# Patient Record
Sex: Male | Born: 1985 | Hispanic: Yes | Marital: Single | Smoking: Current every day smoker
Health system: Southern US, Community
[De-identification: ages and names within clinical notes are randomized; demographics above are authoritative.]

## PROBLEM LIST (undated history)

## (undated) DIAGNOSIS — K5909 Other constipation: Secondary | ICD-10-CM

## (undated) DIAGNOSIS — K255 Chronic or unspecified gastric ulcer with perforation: Secondary | ICD-10-CM

## (undated) DIAGNOSIS — K2970 Gastritis, unspecified, without bleeding: Secondary | ICD-10-CM

## (undated) DIAGNOSIS — Z720 Tobacco use: Secondary | ICD-10-CM

## (undated) HISTORY — DX: Chronic or unspecified gastric ulcer with perforation: K25.5

---

## 2014-03-26 ENCOUNTER — Emergency Department (HOSPITAL_COMMUNITY)
Admission: EM | Admit: 2014-03-26 | Discharge: 2014-03-26 | Disposition: A | Discharge status: Home / Self Care | Payer: Self-pay | Source: Home / Self Care

## 2014-03-26 ENCOUNTER — Emergency Department (INDEPENDENT_AMBULATORY_CARE_PROVIDER_SITE_OTHER): Payer: Self-pay

## 2014-03-26 ENCOUNTER — Encounter (HOSPITAL_COMMUNITY): Payer: Self-pay | Admitting: Emergency Medicine

## 2014-03-26 DIAGNOSIS — K2900 Acute gastritis without bleeding: Secondary | ICD-10-CM

## 2014-03-26 MED ORDER — FAMOTIDINE 20 MG PO TABS
ORAL_TABLET | ORAL | Status: AC
  Filled 2014-03-26: qty 1

## 2014-03-26 MED ORDER — GI COCKTAIL ~~LOC~~
30.0000 mL | Freq: Once | ORAL | Status: AC
  Administered 2014-03-26: 30 mL via ORAL

## 2014-03-26 MED ORDER — FAMOTIDINE 20 MG PO TABS
20.0000 mg | ORAL_TABLET | Freq: Once | ORAL | Status: AC
  Administered 2014-03-26: 20 mg via ORAL

## 2014-03-26 MED ORDER — SUCRALFATE 1 GM/10ML PO SUSP: 1.0000 g | Freq: Three times a day (TID) | ORAL | Status: DC

## 2014-03-26 MED ORDER — ONDANSETRON 4 MG PO TBDP
4.0000 mg | ORAL_TABLET | Freq: Once | ORAL | Status: AC
  Administered 2014-03-26: 4 mg via ORAL

## 2014-03-26 MED ORDER — ONDANSETRON 4 MG PO TBDP
ORAL_TABLET | ORAL | Status: AC
  Filled 2014-03-26: qty 1

## 2014-03-26 MED ORDER — ONDANSETRON HCL 4 MG PO TABS: 4.0000 mg | ORAL_TABLET | Freq: Four times a day (QID) | ORAL | Status: DC

## 2014-03-26 MED ORDER — OMEPRAZOLE 20 MG PO CPDR
DELAYED_RELEASE_CAPSULE | ORAL | Status: DC
Start: 2014-03-26 — End: 2020-04-29

## 2014-03-26 MED ORDER — GI COCKTAIL ~~LOC~~
ORAL | Status: AC
  Filled 2014-03-26: qty 30

## 2014-03-26 NOTE — ED Provider Notes (Signed)
Medical screening examination/treatment/procedure(s) were performed by resident physician or non-physician practitioner and as supervising physician I was immediately available for consultation/collaboration.   Arlicia Paquette DOUGLAS MD.   Rexann Lueras D Si Jachim, MD 03/26/14 1316 

## 2014-03-26 NOTE — ED Notes (Signed)
Pt c/o epigastric abd pain onset 3 days Pain is constant and increases w/activity and causes him to vomit Sx also include swelling/bloating??? Denies f/v/n/d, urinary sx Alert w/no signs of acute distress.

## 2014-03-26 NOTE — Discharge Instructions (Signed)
Gastritis - Adultos   (Gastritis, Adult)   La gastrittis es la irritación (inflamación) de la membrana interna del estómago. Puede ser una enfermedad de inicio súbito (aguda) o de largo plazo (crónica). Si la gastritis no se trata, puede causar sangrado y úlceras.  CAUSAS   La gastritis se produce cuando la membrana que tapiza interiormente al estómago se debilita o se daña. Los jugos digestivos del estómago inflaman el revestimiento del estómago debilitado. El revestimiento del estómago puede debilitarse o dañarse por una infección viral o bacteriana. La infección bacteriana más común es la infección por Helicobacter pylori. También puede ser el resultado del consumo excesivo de alcohol, por el uso de ciertos medicamentos o porque hay demasiado ácido en el estómago.   SÍNTOMAS   En algunos casos no hay síntomas. Si se presentan síntomas, éstos pueden ser:   · Dolor o sensación de ardor en la parte superior del abdomen.  · Náuseas.  · Vómitos.  · Sensación molesta de distensión después de comer.  DIAGNÓSTICO   El médico puede diagnosticar gastritis según los síntomas y el examen físico. Para determinar la causa de la gastritis, el médico podrá:   · Pedir análisis de sangre o de materia fecal para diagnosticar la presencia de la bacteria H pylori.  · Gastroscopía. Un tubo delgado y flexible (endoscopio) se pasa por el esófago hasta llegar al estómago. El endoscopio tiene una luz y una cámara en el extremo. El médico utilizará el endoscopio para observar el interior del estómago.  · Tomará una muestra de tejido (biopsia) del estómago para examinarlo en el microscopio.  TRATAMIENTO   Según la causa de la gastritis podrán recetarle: Antibióticos, si la causa es una infección bacteriana, como una infección por H. pylori. Antiácidos o bloqueadores H2, si hay demasiado ácido en el estómago. El médico le aconsejará que deje de tomar aspirina, ibuprofeno u otros antiinflamatorios no esteroides (AINE).   INSTRUCCIONES PARA EL  CUIDADO EN EL HOGAR   · Tome sólo medicamentos de venta libre o recetados, según las indicaciones del médico.  · Si le han recetado antibióticos, tómelos según las indicaciones. Tómelos todos, aunque se sienta mejor.  · Debe ingerir gran cantidad de líquido para mantener la orina de tono claro o color amarillo pálido.  · Evite las comidas y bebidas que empeoran los problemas, como:  · Bebidas con cafeína o alcohólicas.  · Chocolate.  · Sabores a menta.  · Ajo y cebolla.  · Comidas muy condimentadas.  · Cítricos como naranjas, limones o limas.  · Alimentos que contengan tomate, como salsas, chile y pizza.  · Alimentos fritos y grasos.  · Haga comidas pequeñas durante el día en lugar de 3 comidas abundantes.  SOLICITE ATENCIÓN MÉDICA DE INMEDIATO SI:   · La materia fecal es negra o de color rojo oscuro.  · Vomita sangre de color rojo brillante o material similar a granos de café.  · No puede retener los líquidos.  · El dolor abdominal empeora.  · Tiene fiebre.  · No mejora luego de 1 semana.  · Tiene preguntas o preocupaciones.  ASEGÚRESE DE QUE:   · Comprende estas instrucciones.  · Controlará su enfermedad.  · Solicitará ayuda de inmediato si no mejora o si empeora.  Document Released: 07/31/2005 Document Revised: 07/15/2012  [employer]® Patient Information ©2014 [employer].

## 2014-03-26 NOTE — ED Provider Notes (Signed)
CSN: [id number]     Arrival date & time 03/26/14  0944 History   First MD Initiated Contact with Patient 03/26/14 1115     Chief Complaint  Patient presents with  . Abdominal Pain   (Consider location/radiation/quality/duration/timing/severity/associated sxs/prior Treatment) HPI Comments: 28 year old Hispanic male who does not speak [place]. 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  Patient complains of epigastric pain for 3 days. The pain is constant and does not radiate. It is often worse with movement. Nothing else seems to make it worse or better. He denies known trauma. He denies recent alcohol use.  Associated symptoms include nausea and vomiting. He vomited 3 times yesterday but none today. Denies fever, diarrhea or constipation. He has not eaten today. Denies GU symptoms. He does note that he has a history of gastritis but does not know what kind or what caused it.   History reviewed. No pertinent past medical history. History reviewed. No pertinent past surgical history. No family history on file. History  Substance Use Topics  . Smoking status: Current Every Day Smoker    Types: Cigarettes  . Smokeless tobacco: Not on file  . Alcohol Use: Yes    Review of Systems  Constitutional: Positive for activity change and appetite change.  HENT: Negative.   Respiratory: Negative.   Cardiovascular: Negative.   Gastrointestinal: Positive for nausea, vomiting and abdominal pain. Negative for constipation, abdominal distention and anal bleeding.  Genitourinary: Negative.   Skin: Negative.   Neurological: Negative.     Allergies  Review of patient's allergies indicates no known allergies.  Home Medications   Prior to Admission medications   Not on File   BP 125/90  Pulse 68  Temp(Src) 98.5 F (36.9 C) (Oral)  Resp 18  SpO2 100% Physical Exam  Nursing note and vitals reviewed. Constitutional: He is oriented to person, place, and time. He appears well-developed and well-nourished. No  distress.  Neck: Normal range of motion. Neck supple.  Cardiovascular: Normal rate, regular rhythm and normal heart sounds.   Pulmonary/Chest: Effort normal and breath sounds normal. No respiratory distress. He has no wheezes. He has no rales.  Abdominal: Soft. Bowel sounds are normal. He exhibits no distension and no mass. There is tenderness. There is no rebound and no guarding.  Tenderness in the epigastrium only.  Percusses tympanic upper half of abd.  Musculoskeletal: He exhibits no edema.  Neurological: He is alert and oriented to person, place, and time. He exhibits normal muscle tone.  Skin: Skin is warm and dry.    ED Course  Procedures (including critical care time) Labs Review Labs Reviewed - No data to display  Imaging Review Dg Abd 1 View  03/26/2014   CLINICAL DATA:  Vomiting, epigastric pain  EXAM: ABDOMEN - 1 VIEW  COMPARISON:  None.  FINDINGS: Small bowel decompressed. Moderate fecal material near the hepatic flexure. Gaseous distention of a segment of colon in the left mid abdomen. Remainder colon and rectum are decompressed. No abnormal abdominal calcifications. Visualized bones unremarkable.  IMPRESSION: Nonobstructive bowel gas pattern.   Electronically Signed   By: Oley Balmaniel  Hassell M.D.   On: 03/26/2014 11:46     MDM   1. Gastritis, acute    1215: Pt st he feels much better. Pain nearly abated. No N,V.  Follow PCP, see above. Omeprazole 20 mg bid x 15 d carafate as dir zofran 4 mg po q 6h prn n,v.  No acidic or spicy foods. No ETOH. Clear liquids    Hayden Rasmussenavid Genola Yuille,  NP 03/26/14 1225

## 2015-08-05 DIAGNOSIS — K255 Chronic or unspecified gastric ulcer with perforation: Secondary | ICD-10-CM

## 2015-08-05 HISTORY — DX: Chronic or unspecified gastric ulcer with perforation: K25.5

## 2015-08-15 ENCOUNTER — Emergency Department (HOSPITAL_COMMUNITY): Payer: Self-pay

## 2015-08-15 ENCOUNTER — Emergency Department (HOSPITAL_COMMUNITY): Payer: MEDICAID | Admitting: Anesthesiology

## 2015-08-15 ENCOUNTER — Emergency Department (HOSPITAL_COMMUNITY): Payer: Self-pay | Admitting: Anesthesiology

## 2015-08-15 ENCOUNTER — Encounter (HOSPITAL_COMMUNITY): Admission: EM | Disposition: A | Discharge status: Home / Self Care | Payer: Self-pay | Source: Home / Self Care

## 2015-08-15 ENCOUNTER — Inpatient Hospital Stay (HOSPITAL_COMMUNITY)
Admission: EM | Admit: 2015-08-15 | Discharge: 2015-08-19 | DRG: 329 | Disposition: A | Discharge status: Home / Self Care | Payer: Self-pay | Attending: Surgery | Admitting: Surgery

## 2015-08-15 ENCOUNTER — Encounter (HOSPITAL_COMMUNITY): Payer: Self-pay | Admitting: Emergency Medicine

## 2015-08-15 DIAGNOSIS — B9681 Helicobacter pylori [H. pylori] as the cause of diseases classified elsewhere: Secondary | ICD-10-CM | POA: Diagnosis present

## 2015-08-15 DIAGNOSIS — R188 Other ascites: Secondary | ICD-10-CM | POA: Diagnosis present

## 2015-08-15 DIAGNOSIS — K668 Other specified disorders of peritoneum: Secondary | ICD-10-CM

## 2015-08-15 DIAGNOSIS — K659 Peritonitis, unspecified: Secondary | ICD-10-CM | POA: Diagnosis present

## 2015-08-15 DIAGNOSIS — K5900 Constipation, unspecified: Secondary | ICD-10-CM | POA: Diagnosis present

## 2015-08-15 DIAGNOSIS — K5909 Other constipation: Secondary | ICD-10-CM

## 2015-08-15 DIAGNOSIS — IMO0001 Reserved for inherently not codable concepts without codable children: Secondary | ICD-10-CM

## 2015-08-15 DIAGNOSIS — Z720 Tobacco use: Secondary | ICD-10-CM

## 2015-08-15 DIAGNOSIS — F17200 Nicotine dependence, unspecified, uncomplicated: Secondary | ICD-10-CM | POA: Diagnosis present

## 2015-08-15 DIAGNOSIS — K265 Chronic or unspecified duodenal ulcer with perforation: Principal | ICD-10-CM | POA: Diagnosis present

## 2015-08-15 DIAGNOSIS — F17210 Nicotine dependence, cigarettes, uncomplicated: Secondary | ICD-10-CM | POA: Diagnosis present

## 2015-08-15 HISTORY — PX: LAPAROTOMY: SHX154

## 2015-08-15 HISTORY — DX: Tobacco use: Z72.0

## 2015-08-15 HISTORY — DX: Other constipation: K59.09

## 2015-08-15 HISTORY — DX: Gastritis, unspecified, without bleeding: K29.70

## 2015-08-15 LAB — COMPREHENSIVE METABOLIC PANEL
ALK PHOS: 70 U/L (ref 38–126)
ALT: 16 U/L — ABNORMAL LOW (ref 17–63)
ANION GAP: 8 (ref 5–15)
AST: 24 U/L (ref 15–41)
Albumin: 4.1 g/dL (ref 3.5–5.0)
BUN: 9 mg/dL (ref 6–20)
CO2: 28 mmol/L (ref 22–32)
Calcium: 9.3 mg/dL (ref 8.9–10.3)
Chloride: 101 mmol/L (ref 101–111)
Creatinine, Ser: 0.7 mg/dL (ref 0.61–1.24)
GFR calc Af Amer: >60 mL/min (ref >60)
GFR calc non Af Amer: >60 mL/min (ref >60)
Glucose, Bld: 161 mg/dL — ABNORMAL HIGH (ref 65–99)
Potassium: 3.2 mmol/L — ABNORMAL LOW (ref 3.5–5.1)
SODIUM: 137 mmol/L (ref 135–145)
Total Bilirubin: 0.8 mg/dL (ref 0.3–1.2)
Total Protein: 7.3 g/dL (ref 6.5–8.1)

## 2015-08-15 LAB — CBC
HEMATOCRIT: 39.4 % (ref 39.0–52.0)
HEMOGLOBIN: 14.1 g/dL (ref 13.0–17.0)
MCH: 30.9 pg (ref 26.0–34.0)
MCHC: 35.8 g/dL (ref 30.0–36.0)
MCV: 86.4 fL (ref 78.0–100.0)
Platelets: 388 10*3/uL (ref 150–400)
RBC: 4.56 MIL/uL (ref 4.22–5.81)
RDW: 13.1 % (ref 11.5–15.5)
WBC: 10.8 10*3/uL — ABNORMAL HIGH (ref 4.0–10.5)

## 2015-08-15 LAB — URINE MICROSCOPIC-ADD ON

## 2015-08-15 LAB — URINALYSIS, ROUTINE W REFLEX MICROSCOPIC
Bilirubin Urine: NEGATIVE
GLUCOSE, UA: NEGATIVE mg/dL
HGB URINE DIPSTICK: NEGATIVE
Ketones, ur: NEGATIVE mg/dL
Leukocytes, UA: NEGATIVE
Nitrite: NEGATIVE
Protein, ur: NEGATIVE mg/dL
SPECIFIC GRAVITY, URINE: 1.02 (ref 1.005–1.030)
Urobilinogen, UA: 1 mg/dL (ref 0.0–1.0)
pH: 8 (ref 5.0–8.0)

## 2015-08-15 LAB — LIPASE, BLOOD: Lipase: 24 U/L (ref 22–51)

## 2015-08-15 LAB — RAPID URINE DRUG SCREEN, HOSP PERFORMED
AMPHETAMINES: POSITIVE — AB
BARBITURATES: NOT DETECTED
BENZODIAZEPINES: NOT DETECTED
Cocaine: NOT DETECTED
Opiates: NOT DETECTED
Tetrahydrocannabinol: NOT DETECTED

## 2015-08-15 LAB — ETHANOL: Alcohol, Ethyl (B): <5 mg/dL (ref <5)

## 2015-08-15 SURGERY — LAPAROSCOPY, DIAGNOSTIC
Anesthesia: General

## 2015-08-15 SURGERY — LAPAROTOMY, EXPLORATORY
Anesthesia: General

## 2015-08-15 MED ORDER — PHENYLEPHRINE HCL 10 MG/ML IJ SOLN
INTRAMUSCULAR | Status: DC | PRN
  Administered 2015-08-15 (×2): 80 ug via INTRAVENOUS

## 2015-08-15 MED ORDER — LACTATED RINGERS IV SOLN
INTRAVENOUS | Status: DC
  Administered 2015-08-15 – 2015-08-16 (×2): via INTRAVENOUS

## 2015-08-15 MED ORDER — HYDROMORPHONE HCL 1 MG/ML IJ SOLN: 0.2500 mg | INTRAMUSCULAR | Status: DC | PRN

## 2015-08-15 MED ORDER — SUCCINYLCHOLINE CHLORIDE 20 MG/ML IJ SOLN
INTRAMUSCULAR | Status: DC | PRN
  Administered 2015-08-15: 100 mg via INTRAVENOUS

## 2015-08-15 MED ORDER — LIDOCAINE HCL (CARDIAC) 20 MG/ML IV SOLN
INTRAVENOUS | Status: DC | PRN
  Administered 2015-08-15: 100 mg via INTRAVENOUS

## 2015-08-15 MED ORDER — HYDROMORPHONE HCL 1 MG/ML IJ SOLN
0.5000 mg | INTRAMUSCULAR | Status: DC | PRN
  Administered 2015-08-15: 1 mg via INTRAVENOUS
  Filled 2015-08-15 (×2): qty 1

## 2015-08-15 MED ORDER — LACTATED RINGERS IV BOLUS (SEPSIS)
1000.0000 mL | Freq: Three times a day (TID) | INTRAVENOUS | Status: AC | PRN
Start: 2015-08-15 — End: 2015-08-17

## 2015-08-15 MED ORDER — LACTATED RINGERS IV BOLUS (SEPSIS): 1000.0000 mL | Freq: Three times a day (TID) | INTRAVENOUS | Status: AC | PRN

## 2015-08-15 MED ORDER — PIPERACILLIN-TAZOBACTAM 3.375 G IVPB
3.3750 g | Freq: Three times a day (TID) | INTRAVENOUS | Status: DC
  Administered 2015-08-15 – 2015-08-19 (×12): 3.375 g via INTRAVENOUS
  Filled 2015-08-15 (×14): qty 50

## 2015-08-15 MED ORDER — ACETAMINOPHEN 650 MG RE SUPP: 650.0000 mg | Freq: Four times a day (QID) | RECTAL | Status: DC | PRN

## 2015-08-15 MED ORDER — SODIUM CHLORIDE 0.9 % IJ SOLN
INTRAMUSCULAR | Status: AC
  Filled 2015-08-15: qty 10

## 2015-08-15 MED ORDER — PIPERACILLIN-TAZOBACTAM 3.375 G IVPB
INTRAVENOUS | Status: AC
  Filled 2015-08-15: qty 50

## 2015-08-15 MED ORDER — ONDANSETRON HCL 40 MG/20ML IJ SOLN
8.0000 mg | Freq: Four times a day (QID) | INTRAMUSCULAR | Status: DC | PRN
  Filled 2015-08-15: qty 4

## 2015-08-15 MED ORDER — ALUM & MAG HYDROXIDE-SIMETH 200-200-20 MG/5ML PO SUSP: 30.0000 mL | Freq: Four times a day (QID) | ORAL | Status: DC | PRN

## 2015-08-15 MED ORDER — LIP MEDEX EX OINT
1.0000 "application " | TOPICAL_OINTMENT | Freq: Two times a day (BID) | CUTANEOUS | Status: DC
  Administered 2015-08-15 – 2015-08-19 (×6): 1 via TOPICAL
  Filled 2015-08-15 (×2): qty 7

## 2015-08-15 MED ORDER — PIPERACILLIN-TAZOBACTAM 3.375 G IVPB 30 MIN
3.3750 g | INTRAVENOUS | Status: DC
Start: 2015-08-15 — End: 2015-08-15

## 2015-08-15 MED ORDER — PIPERACILLIN-TAZOBACTAM 3.375 G IVPB 30 MIN
3.3750 g | Freq: Once | INTRAVENOUS | Status: AC
  Administered 2015-08-15: 3.375 g via INTRAVENOUS
  Filled 2015-08-15: qty 50

## 2015-08-15 MED ORDER — MENTHOL 3 MG MT LOZG
1.0000 | LOZENGE | OROMUCOSAL | Status: DC | PRN
Start: 2015-08-15 — End: 2015-08-19
  Filled 2015-08-15: qty 9

## 2015-08-15 MED ORDER — ONDANSETRON HCL 4 MG/2ML IJ SOLN: 4.0000 mg | Freq: Four times a day (QID) | INTRAMUSCULAR | Status: DC | PRN

## 2015-08-15 MED ORDER — PROPOFOL 10 MG/ML IV BOLUS
INTRAVENOUS | Status: AC
  Filled 2015-08-15: qty 20

## 2015-08-15 MED ORDER — PHENOL 1.4 % MT LIQD
2.0000 | OROMUCOSAL | Status: DC | PRN
  Filled 2015-08-15: qty 177

## 2015-08-15 MED ORDER — LORAZEPAM 2 MG/ML IJ SOLN: 0.5000 mg | Freq: Three times a day (TID) | INTRAMUSCULAR | Status: DC | PRN

## 2015-08-15 MED ORDER — GI COCKTAIL ~~LOC~~: 30.0000 mL | Freq: Once | ORAL | Status: DC

## 2015-08-15 MED ORDER — SODIUM CHLORIDE 0.9 % IV SOLN
INTRAVENOUS | Status: DC
  Filled 2015-08-15: qty 6

## 2015-08-15 MED ORDER — LACTATED RINGERS IV BOLUS (SEPSIS)
1000.0000 mL | Freq: Once | INTRAVENOUS | Status: AC
  Administered 2015-08-15: 1000 mL via INTRAVENOUS

## 2015-08-15 MED ORDER — SODIUM CHLORIDE 0.9 % IV SOLN: INTRAVENOUS | Status: DC

## 2015-08-15 MED ORDER — OXYCODONE HCL 5 MG PO TABS: 5.0000 mg | ORAL_TABLET | Freq: Once | ORAL | Status: DC | PRN

## 2015-08-15 MED ORDER — PROMETHAZINE HCL 25 MG/ML IJ SOLN
6.2500 mg | INTRAMUSCULAR | Status: DC | PRN
  Filled 2015-08-15: qty 1

## 2015-08-15 MED ORDER — FENTANYL CITRATE (PF) 100 MCG/2ML IJ SOLN
INTRAMUSCULAR | Status: DC | PRN
  Administered 2015-08-15 (×2): 50 ug via INTRAVENOUS
  Administered 2015-08-15: 100 ug via INTRAVENOUS
  Administered 2015-08-15 (×3): 50 ug via INTRAVENOUS

## 2015-08-15 MED ORDER — SODIUM CHLORIDE 0.9 % IV BOLUS (SEPSIS)
2000.0000 mL | Freq: Once | INTRAVENOUS | Status: AC
  Administered 2015-08-15: 2000 mL via INTRAVENOUS

## 2015-08-15 MED ORDER — FENTANYL CITRATE (PF) 250 MCG/5ML IJ SOLN
INTRAMUSCULAR | Status: AC
  Filled 2015-08-15: qty 25

## 2015-08-15 MED ORDER — SODIUM CHLORIDE 0.9 % IV BOLUS (SEPSIS)
1000.0000 mL | Freq: Once | INTRAVENOUS | Status: AC
  Administered 2015-08-15: 1000 mL via INTRAVENOUS

## 2015-08-15 MED ORDER — CETYLPYRIDINIUM CHLORIDE 0.05 % MT LIQD
7.0000 mL | Freq: Two times a day (BID) | OROMUCOSAL | Status: DC
  Administered 2015-08-15 – 2015-08-18 (×6): 7 mL via OROMUCOSAL

## 2015-08-15 MED ORDER — METOPROLOL TARTRATE 1 MG/ML IV SOLN
5.0000 mg | Freq: Four times a day (QID) | INTRAVENOUS | Status: DC | PRN
  Filled 2015-08-15: qty 5

## 2015-08-15 MED ORDER — HYDROMORPHONE HCL 1 MG/ML IJ SOLN
0.5000 mg | INTRAMUSCULAR | Status: DC | PRN
  Administered 2015-08-16: 1 mg via INTRAVENOUS

## 2015-08-15 MED ORDER — ENOXAPARIN SODIUM 40 MG/0.4ML ~~LOC~~ SOLN
40.0000 mg | SUBCUTANEOUS | Status: DC
  Administered 2015-08-16 – 2015-08-19 (×4): 40 mg via SUBCUTANEOUS
  Filled 2015-08-15 (×5): qty 0.4

## 2015-08-15 MED ORDER — MIDAZOLAM HCL 2 MG/2ML IJ SOLN
INTRAMUSCULAR | Status: AC
  Filled 2015-08-15: qty 4

## 2015-08-15 MED ORDER — LACTATED RINGERS IV SOLN
INTRAVENOUS | Status: DC | PRN
  Administered 2015-08-15: 07:00:00 via INTRAVENOUS

## 2015-08-15 MED ORDER — IOHEXOL 300 MG/ML  SOLN
100.0000 mL | Freq: Once | INTRAMUSCULAR | Status: AC | PRN
  Administered 2015-08-15: 100 mL via INTRAVENOUS

## 2015-08-15 MED ORDER — ONDANSETRON HCL 4 MG/2ML IJ SOLN
INTRAMUSCULAR | Status: DC | PRN
  Administered 2015-08-15: 4 mg via INTRAVENOUS

## 2015-08-15 MED ORDER — SODIUM CHLORIDE 0.9 % IR SOLN
Status: DC | PRN
  Administered 2015-08-15: 7000 mL

## 2015-08-15 MED ORDER — VANCOMYCIN HCL IN DEXTROSE 1-5 GM/200ML-% IV SOLN
1000.0000 mg | Freq: Once | INTRAVENOUS | Status: AC
  Administered 2015-08-15: 1000 mg via INTRAVENOUS
  Filled 2015-08-15: qty 200

## 2015-08-15 MED ORDER — PROPOFOL 10 MG/ML IV BOLUS
INTRAVENOUS | Status: DC | PRN
  Administered 2015-08-15: 200 mg via INTRAVENOUS

## 2015-08-15 MED ORDER — SODIUM CHLORIDE 0.9 % IV SOLN
80.0000 mg | Freq: Two times a day (BID) | INTRAVENOUS | Status: DC
  Administered 2015-08-15 – 2015-08-18 (×7): 80 mg via INTRAVENOUS
  Filled 2015-08-15 (×9): qty 80

## 2015-08-15 MED ORDER — FENTANYL CITRATE (PF) 100 MCG/2ML IJ SOLN
INTRAMUSCULAR | Status: AC
  Filled 2015-08-15: qty 4

## 2015-08-15 MED ORDER — SUGAMMADEX SODIUM 200 MG/2ML IV SOLN
INTRAVENOUS | Status: AC
  Filled 2015-08-15: qty 2

## 2015-08-15 MED ORDER — MAGIC MOUTHWASH
15.0000 mL | Freq: Four times a day (QID) | ORAL | Status: DC | PRN
  Filled 2015-08-15: qty 15

## 2015-08-15 MED ORDER — ONDANSETRON HCL 4 MG/2ML IJ SOLN
INTRAMUSCULAR | Status: AC
  Filled 2015-08-15: qty 2

## 2015-08-15 MED ORDER — ONDANSETRON HCL 4 MG/2ML IJ SOLN
4.0000 mg | Freq: Once | INTRAMUSCULAR | Status: AC | PRN
  Administered 2015-08-15: 4 mg via INTRAVENOUS
  Filled 2015-08-15: qty 2

## 2015-08-15 MED ORDER — METHOCARBAMOL 1000 MG/10ML IJ SOLN
1000.0000 mg | Freq: Four times a day (QID) | INTRAVENOUS | Status: DC | PRN
  Filled 2015-08-15: qty 10

## 2015-08-15 MED ORDER — MIDAZOLAM HCL 5 MG/5ML IJ SOLN
INTRAMUSCULAR | Status: DC | PRN
  Administered 2015-08-15: 1 mg via INTRAVENOUS

## 2015-08-15 MED ORDER — BUPIVACAINE-EPINEPHRINE (PF) 0.25% -1:200000 IJ SOLN
INTRAMUSCULAR | Status: AC
  Filled 2015-08-15: qty 60

## 2015-08-15 MED ORDER — LACTATED RINGERS IV SOLN
INTRAVENOUS | Status: DC | PRN
  Administered 2015-08-15 (×2): via INTRAVENOUS

## 2015-08-15 MED ORDER — DEXAMETHASONE SODIUM PHOSPHATE 10 MG/ML IJ SOLN
INTRAMUSCULAR | Status: DC | PRN
  Administered 2015-08-15: 10 mg via INTRAVENOUS

## 2015-08-15 MED ORDER — SUGAMMADEX SODIUM 200 MG/2ML IV SOLN
INTRAVENOUS | Status: DC | PRN
  Administered 2015-08-15: 200 mg via INTRAVENOUS

## 2015-08-15 MED ORDER — OXYCODONE HCL 5 MG/5ML PO SOLN
5.0000 mg | Freq: Once | ORAL | Status: DC | PRN
  Filled 2015-08-15: qty 5

## 2015-08-15 MED ORDER — BUPIVACAINE HCL (PF) 0.25 % IJ SOLN
INTRAMUSCULAR | Status: DC | PRN
  Administered 2015-08-15: 30 mL

## 2015-08-15 MED ORDER — ROCURONIUM BROMIDE 100 MG/10ML IV SOLN
INTRAVENOUS | Status: DC | PRN
  Administered 2015-08-15: 50 mg via INTRAVENOUS

## 2015-08-15 MED ORDER — PANTOPRAZOLE SODIUM 40 MG IV SOLR
40.0000 mg | Freq: Once | INTRAVENOUS | Status: AC
  Administered 2015-08-15: 40 mg via INTRAVENOUS
  Filled 2015-08-15: qty 40

## 2015-08-15 MED ORDER — DIPHENHYDRAMINE HCL 50 MG/ML IJ SOLN: 12.5000 mg | Freq: Four times a day (QID) | INTRAMUSCULAR | Status: DC | PRN

## 2015-08-15 SURGICAL SUPPLY — 51 items
BLADE EXTENDED COATED 6.5IN (ELECTRODE) IMPLANT
BLADE HEX COATED 2.75 (ELECTRODE) ×6 IMPLANT
CABLE HIGH FREQUENCY MONO STRZ (ELECTRODE) ×3 IMPLANT
CATH KIT ON-Q SILVERSOAK 7.5IN (CATHETERS) IMPLANT
COUNTER NEEDLE 20 DBL MAG RED (NEEDLE) ×3 IMPLANT
COVER MAYO STAND STRL (DRAPES) ×6 IMPLANT
COVER SURGICAL LIGHT HANDLE (MISCELLANEOUS) ×3 IMPLANT
DRAIN CHANNEL 19F RND (DRAIN) IMPLANT
DRAPE LAPAROSCOPIC ABDOMINAL (DRAPES) ×3 IMPLANT
DRAPE SHEET LG 3/4 BI-LAMINATE (DRAPES) ×3 IMPLANT
DRAPE UTILITY XL STRL (DRAPES) ×6 IMPLANT
DRAPE WARM FLUID 44X44 (DRAPE) ×3 IMPLANT
DRSG OPSITE POSTOP 4X10 (GAUZE/BANDAGES/DRESSINGS) IMPLANT
DRSG OPSITE POSTOP 4X6 (GAUZE/BANDAGES/DRESSINGS) IMPLANT
DRSG OPSITE POSTOP 4X8 (GAUZE/BANDAGES/DRESSINGS) IMPLANT
ELECT PENCIL ROCKER SW 15FT (MISCELLANEOUS) ×3 IMPLANT
ELECT REM PT RETURN 9FT ADLT (ELECTROSURGICAL) ×3
ELECTRODE REM PT RTRN 9FT ADLT (ELECTROSURGICAL) ×1 IMPLANT
GAUZE SPONGE 4X4 12PLY STRL (GAUZE/BANDAGES/DRESSINGS) ×3 IMPLANT
GLOVE ECLIPSE 8.0 STRL XLNG CF (GLOVE) ×6 IMPLANT
GLOVE INDICATOR 8.0 STRL GRN (GLOVE) ×6 IMPLANT
GOWN STRL REUS W/TWL XL LVL3 (GOWN DISPOSABLE) ×12 IMPLANT
KIT BASIN OR (CUSTOM PROCEDURE TRAY) ×3 IMPLANT
LEGGING LITHOTOMY PAIR STRL (DRAPES) ×3 IMPLANT
LIGASURE IMPACT 36 18CM CVD LR (INSTRUMENTS) IMPLANT
PACK GENERAL/GYN (CUSTOM PROCEDURE TRAY) ×3 IMPLANT
STAPLER VISISTAT 35W (STAPLE) ×3 IMPLANT
SUCTION POOLE TIP (SUCTIONS) ×3 IMPLANT
SUT MNCRL AB 4-0 PS2 18 (SUTURE) IMPLANT
SUT NOV 1 T60/GS (SUTURE) IMPLANT
SUT NOVA NAB DX-16 0-1 5-0 T12 (SUTURE) IMPLANT
SUT NOVA T20/GS 25 (SUTURE) IMPLANT
SUT PDS AB 1 CTX 36 (SUTURE) IMPLANT
SUT PDS AB 2-0 CT2 27 (SUTURE) ×6 IMPLANT
SUT SILK 2 0 (SUTURE) ×2
SUT SILK 2 0 SH CR/8 (SUTURE) ×3 IMPLANT
SUT SILK 2-0 18XBRD TIE 12 (SUTURE) ×1 IMPLANT
SUT SILK 3 0 (SUTURE) ×2
SUT SILK 3 0 SH CR/8 (SUTURE) ×3 IMPLANT
SUT SILK 3-0 18XBRD TIE 12 (SUTURE) ×1 IMPLANT
SUT VIC AB 2-0 SH 18 (SUTURE) ×3 IMPLANT
SUT VIC AB 3-0 SH 18 (SUTURE) ×3 IMPLANT
SUT VICRYL 2 0 18  UND BR (SUTURE) ×2
SUT VICRYL 2 0 18 UND BR (SUTURE) ×1 IMPLANT
TOWEL OR 17X26 10 PK STRL BLUE (TOWEL DISPOSABLE) ×6 IMPLANT
TOWEL OR NON WOVEN STRL DISP B (DISPOSABLE) ×6 IMPLANT
TRAY FOLEY W/METER SILVER 14FR (SET/KITS/TRAYS/PACK) ×3 IMPLANT
TRAY FOLEY W/METER SILVER 16FR (SET/KITS/TRAYS/PACK) ×3 IMPLANT
TROCAR BLADELESS OPT 5 100 (ENDOMECHANICALS) ×3 IMPLANT
TUNNELER SHEATH ON-Q 16GX12 DP (PAIN MANAGEMENT) IMPLANT
YANKAUER SUCT BULB TIP 10FT TU (MISCELLANEOUS) ×3 IMPLANT

## 2015-08-15 NOTE — Care Management Note (Signed)
Case Management Note  Patient Details  Name: Ricardo Mckinney MRN: [id number] Date of Birth: 1986/03/30  Subjective/Objective:                   ruptured ulcer Action/Plan:  Discharge planning Expected Discharge Date:                  Expected Discharge Plan:  Home/Self Care  In-House Referral:     Discharge planning Services  CM Consult, MATCH Program, Indigent Health Clinic  Post Acute Care Choice:    Choice offered to:     DME Arranged:    DME Agency:     HH Arranged:    HH Agency:     Status of Service:  In process, will continue to follow  Medicare Important Message Given:    Date Medicare IM Given:    Medicare IM give by:    Date Additional Medicare IM Given:    Additional Medicare Important Message give by:     If discussed at Long Length of Stay Meetings, dates discussed:    Additional Comments: Utilization Review complete.  Pt post op (less than 4 hours) and from CM record review will have MEDICATION, PCP, INSURANCE, and possible MATCH at discharge.  CM will meet with pt to discuss needs 10/12 when pt is better able to communicate needs. Yves Dill, RN 08/15/2015, 12:30 PM

## 2015-08-15 NOTE — Addendum Note (Signed)
Addendum  created 08/15/15 1028 by Illene Silver, CRNA   Modules edited: Charges VN

## 2015-08-15 NOTE — H&P (Signed)
[facility]  [address] [place], [place] [address] Phone: [phone] FAX: [place]  1985/11/27 [id number]  CARE TEAM:  PCP: No primary care provider on file.  Outpatient Care Team: No care team member to display  Inpatient Treatment Team: Treatment Team: Attending Provider: April Palumbo, MD; Technician: Elwanda Brooklyn, NT; Registered Nurse: Murvin Donning Ward, RN; Registered Nurse: Markus Daft, RN; Consulting Physician: Nolon Nations, MD  This patient is a 29 y.o.male who presents today for surgical evaluation at the request of Dr Randal Buba.   Reason for evaluation: Abdominal pain and free air.  Probable perforated ulcer.  Young Hispanic male originally from [place].  Recalls being told he had gastritis.  Has not drunk alcohol in over a year.  His not on any medications.  Has had intermittent pains for the past few months.  Mild.  However his pain became much more intense over the past few days.  He had worsening nausea and vomiting.  Apparently family called an ambulance and he was brought in somewhat sleepy and confused.  Receive IV fluids and medications.  A little more alert.  Complained of epigastric upper abdominal pain.  Very intense.  X-rays concerning for free air.  CT scan concerning for free air and upper abdomen and fluid and inflammation in the upper digestive tract.  Most suspicious for perforated ulcer.  Patient does have constipation with a bowel movement once or twice a week.  Does not take any nonsteroidals.  Again has been abstinent from alcohol for over a year.  Smokes about a pack and a half of cigarettes a day.  He is here today with his brother-in-law.  While his brother-in-law is relatively bilingual, he speaks very little [place].  We used a phone interpreter.  Emergency department nurse in the room at all times as well.  Patient's father had died and had history of ulcers as well he thinks.  Not note any  family history of other health problems.  No history of diverticulosis or diverticulitis.  No history of heart attack or strokes.  Is otherwise moderately physically active.  History reviewed. No pertinent past medical history.  History reviewed. No pertinent past surgical history.  Social History   Social History  . Marital Status: Married    Spouse Name: N/A  . Number of Children: N/A  . Years of Education: N/A   Occupational History  . Not on file.   Social History Main Topics  . Smoking status: Current Every Day Smoker    Types: Cigarettes  . Smokeless tobacco: Not on file  . Alcohol Use: Yes  . Drug Use: No  . Sexual Activity: Not on file   Other Topics Concern  . Not on file   Social History Narrative    History reviewed. No pertinent family history.  Current Facility-Administered Medications  Medication Dose Route Frequency Provider Last Rate Last Dose  . piperacillin-tazobactam (ZOSYN) IVPB 3.375 g  3.375 g Intravenous Once April Palumbo, MD      . sodium chloride 0.9 % bolus 2,000 mL  2,000 mL Intravenous Once April Palumbo, MD      . vancomycin (VANCOCIN) IVPB 1000 mg/200 mL premix  1,000 mg Intravenous Once April Palumbo, MD       Current Outpatient Prescriptions  Medication Sig Dispense Refill  . omeprazole (PRILOSEC) 20 MG capsule 1 bid x 15 d (Patient not taking: Reported on 08/15/2015) 30 capsule 0  .  ondansetron (ZOFRAN) 4 MG tablet Take 1 tablet (4 mg total) by mouth every 6 (six) hours. As needed for nausea and vomiting (Patient not taking: Reported on 08/15/2015) 12 tablet 0  . sucralfate (CARAFATE) 1 GM/10ML suspension Take 10 mLs (1 g total) by mouth 4 (four) times daily -  with meals and at bedtime. (Patient not taking: Reported on 08/15/2015) 420 mL 0     No Known Allergies  ROS: Constitutional:  No fevers, chills, sweats.  Weight stable Eyes:  No vision changes, No discharge HENT:  No sore throats, nasal drainage Lymph: No neck swelling, No  bruising easily Pulmonary:  No cough, productive sputum CV: No orthopnea, PND  Patient walks without difficulty.  No exertional chest/neck/shoulder/arm pain. GI: No personal nor family history of GI/colon cancer, inflammatory bowel disease, irritable bowel syndrome, allergy such as Celiac Sprue, dietary/dairy problems, colitis.  No recent sick contacts/gastroenteritis.  No recent travel outside the country.  No changes in diet. Renal: No UTIs, No hematuria Genital:  No drainage, bleeding, masses Musculoskeletal: No severe joint pain.  Good ROM major joints Skin:  No sores or lesions.  No rashes Heme/Lymph:  No easy bleeding.  No swollen lymph nodes Neuro: No focal weakness/numbness.  No seizures Psych: No suicidal ideation.  No hallucinations  BP 109/64 mmHg  Pulse 80  Temp(Src) 98.1 F (36.7 C) (Oral)  Resp 20  SpO2 99%  Physical Exam: General: Pt awake/alert/oriented x4 in mild acute distress Eyes: PERRL, normal EOM. Sclera nonicteric Neuro: CN II-XII intact w/o focal sensory/motor deficits. Lymph: No head/neck/groin lymphadenopathy Psych:  No delerium/psychosis/paranoia.  Initially calm.  Then anxious.  Consolable. HENT: Normocephalic, Mucus membranes moist.  No thrush Neck: Supple, No tracheal deviation Chest: No pain.  Good respiratory excursion. CV:  Pulses intact.  Regular rhythm Abdomen: Thin and muscular.  Lower abdomen soft but upper abdomen more firm.  No incisions.  Nondistended.  Very tender in epigastric upper abdomen with some mild guarding.  Reproduction with cough.  Concerning for early peritonitis.  Nontender.  No  hernias. Genital: Normal external male genitalia.  No inguinal hernias. Ext:  SCDs BLE.  No significant edema.  No cyanosis Skin: No petechiae / purpurea.  No major sores Musculoskeletal: No severe joint pain.  Good ROM major joints   Results:   Labs: Results for orders placed or performed during the hospital encounter of 08/15/15 (from the past 48  hour(s))  Lipase, blood     Status: None   Collection Time: 08/15/15  1:34 AM  Result Value Ref Range   Lipase 24 22 - 51 U/L  Comprehensive metabolic panel     Status: Abnormal   Collection Time: 08/15/15  1:34 AM  Result Value Ref Range   Sodium 137 135 - 145 mmol/L   Potassium 3.2 (L) 3.5 - 5.1 mmol/L   Chloride 101 101 - 111 mmol/L   CO2 28 22 - 32 mmol/L   Glucose, Bld 161 (H) 65 - 99 mg/dL   BUN 9 6 - 20 mg/dL   Creatinine, Ser 0.70 0.61 - 1.24 mg/dL   Calcium 9.3 8.9 - 10.3 mg/dL   Total Protein 7.3 6.5 - 8.1 g/dL   Albumin 4.1 3.5 - 5.0 g/dL   AST 24 15 - 41 U/L   ALT 16 (L) 17 - 63 U/L   Alkaline Phosphatase 70 38 - 126 U/L   Total Bilirubin 0.8 0.3 - 1.2 mg/dL   GFR calc non Af Amer >60 >60 mL/min  GFR calc Af Amer >60 >60 mL/min    Comment: (NOTE) The eGFR has been calculated using the CKD EPI equation. This calculation has not been validated in all clinical situations. eGFR's persistently <60 mL/min signify possible Chronic Kidney Disease.    Anion gap 8 5 - 15  CBC     Status: Abnormal   Collection Time: 08/15/15  1:34 AM  Result Value Ref Range   WBC 10.8 (H) 4.0 - 10.5 K/uL   RBC 4.56 4.22 - 5.81 MIL/uL   Hemoglobin 14.1 13.0 - 17.0 g/dL   HCT 39.4 39.0 - 52.0 %   MCV 86.4 78.0 - 100.0 fL   MCH 30.9 26.0 - 34.0 pg   MCHC 35.8 30.0 - 36.0 g/dL   RDW 13.1 11.5 - 15.5 %   Platelets 388 150 - 400 K/uL  Ethanol     Status: None   Collection Time: 08/15/15  1:40 AM  Result Value Ref Range   Alcohol, Ethyl (B) <5 <5 mg/dL    Comment:        LOWEST DETECTABLE LIMIT FOR SERUM ALCOHOL IS 5 mg/dL FOR MEDICAL PURPOSES ONLY   Urinalysis, Routine w reflex microscopic (not at [facility])     Status: Abnormal   Collection Time: 08/15/15  1:49 AM  Result Value Ref Range   Color, Urine AMBER (A) YELLOW    Comment: BIOCHEMICALS MAY BE AFFECTED BY COLOR   APPearance TURBID (A) CLEAR   Specific Gravity, Urine 1.020 1.005 - 1.030   pH 8.0 5.0 - 8.0   Glucose, UA  NEGATIVE NEGATIVE mg/dL   Hgb urine dipstick NEGATIVE NEGATIVE   Bilirubin Urine NEGATIVE NEGATIVE   Ketones, ur NEGATIVE NEGATIVE mg/dL   Protein, ur NEGATIVE NEGATIVE mg/dL   Urobilinogen, UA 1.0 0.0 - 1.0 mg/dL   Nitrite NEGATIVE NEGATIVE   Leukocytes, UA NEGATIVE NEGATIVE  Urine rapid drug screen (hosp performed)     Status: Abnormal   Collection Time: 08/15/15  1:49 AM  Result Value Ref Range   Opiates NONE DETECTED NONE DETECTED   Cocaine NONE DETECTED NONE DETECTED   Benzodiazepines NONE DETECTED NONE DETECTED   Amphetamines POSITIVE (A) NONE DETECTED   Tetrahydrocannabinol NONE DETECTED NONE DETECTED   Barbiturates NONE DETECTED NONE DETECTED    Comment:        DRUG SCREEN FOR MEDICAL PURPOSES ONLY.  IF CONFIRMATION IS NEEDED FOR ANY PURPOSE, NOTIFY LAB WITHIN 5 DAYS.        LOWEST DETECTABLE LIMITS FOR URINE DRUG SCREEN Drug Class       Cutoff (ng/mL) Amphetamine      1000 Barbiturate      200 Benzodiazepine   867 Tricyclics       619 Opiates          300 Cocaine          300 THC              50   Urine microscopic-add on     Status: Abnormal   Collection Time: 08/15/15  1:49 AM  Result Value Ref Range   WBC, UA 0-2 <3 WBC/hpf   Bacteria, UA FEW (A) RARE   Urine-Other MUCOUS PRESENT     Comment: AMORPHOUS URATES/PHOSPHATES    Imaging / Studies: Ct Abdomen Pelvis W Contrast  08/15/2015   CLINICAL DATA:  Right upper quadrant pain for 5 months, worse tonight. Nausea. No known injury. Elevated white cell count. Free air demonstrated on abdominal radiographs.  EXAM: CT ABDOMEN  AND PELVIS WITH CONTRAST  TECHNIQUE: Multidetector CT imaging of the abdomen and pelvis was performed using the standard protocol following bolus administration of intravenous contrast.  CONTRAST:  153mL OMNIPAQUE IOHEXOL 300 MG/ML  SOLN  COMPARISON:  Abdomen 08/15/2015  FINDINGS: Lung bases are clear.  There is free intra-abdominal air demonstrated mostly in the upper abdomen and beneath the  anterior hemidiaphragms. Small gas bubbles are present in the porta hepatis. There is a small amount of free fluid demonstrated around the gastric pylorus and duodenal bulb region. Bowel wall is mildly thickened in this area. Free fluid also tracks along the pericolic gutters into the pelvis. Fluid attenuation is mildly increased. Appearance is likely due to peptic ulcer disease involving the pylorus and/or duodenal bulb with associated perforation. No pneumatosis or portal venous gas is identified.  Cholelithiasis with contracted gallbladder. No gallbladder wall thickening. No bile duct dilatation. Liver, spleen, pancreas, adrenal glands, kidneys, abdominal aorta, inferior vena cava, and retroperitoneal lymph nodes are unremarkable. Small bowel and colon are not abnormally distended. Stool in the colon.  Pelvis: Appendix is not identified. Bladder is decompressed. Prostate gland is not enlarged. No pelvic mass or lymphadenopathy. No destructive bone lesions.  IMPRESSION: Free intra-abdominal air and free fluid in the abdomen and pelvis. Fluid is present around the pyloric region of the stomach and the duodenal bulb. This suggests probable perforating ulcer as cause of free air and free fluid. Cholelithiasis.  These results were called by telephone at the time of interpretation on 08/15/2015 at 4:33 am to Dr. Veatrice Kells , who verbally acknowledged these results.   Electronically Signed   By: Lucienne Capers M.D.   On: 08/15/2015 04:36   Dg Abd Acute W/chest  08/15/2015   CLINICAL DATA:  Initial evaluation for 1 year history of right upper quadrant pain, worse this morning.  EXAM: DG ABDOMEN ACUTE W/ 1V CHEST  COMPARISON:  Prior radiograph from 03/26/2014.  FINDINGS: Cardiac and mediastinal silhouettes are within normal limits.  Lungs are normally inflated. No focal infiltrate, pulmonary edema, or pleural effusion. No pneumothorax.  Bowel gas pattern within normal limits without evidence of obstruction or  ileus. Moderate amount retained stool within the colon. On upright projection, there is gas lucency subjacent to the right hemidiaphragm, suspicious for small amount of free air. This is seen on both upright chest and upright abdominal films.  No soft tissue mass or abnormal calcification.  No acute osseus abnormality.  IMPRESSION: 1. Small amount of gas lucency subjacent to the right hemidiaphragm, concerning for free intraperitoneal air. Further evaluation with dedicated cross-sectional imaging of the abdomen and pelvis recommended. 2. Nonobstructive bowel gas pattern. 3. No active cardiopulmonary disease. Critical Value/emergent results were called by telephone at the time of interpretation on 08/15/2015 at 3:43 am to Dr. Veatrice Kells , who verbally acknowledged these results.   Electronically Signed   By: Jeannine Boga M.D.   On: 08/15/2015 03:45    Medications / Allergies: per chart  Antibiotics: Anti-infectives    Start     Dose/Rate Route Frequency Ordered Stop   08/15/15 0445  vancomycin (VANCOCIN) IVPB 1000 mg/200 mL premix     1,000 mg 200 mL/hr over 60 Minutes Intravenous  Once 08/15/15 0431     08/15/15 0445  piperacillin-tazobactam (ZOSYN) IVPB 3.375 g     3.375 g 100 mL/hr over 30 Minutes Intravenous  Once 08/15/15 [place]  29 y.o. male  Problem List:  Principal Problem:   Perforated duodenal ulcer  Active Problems:   Spanish speaking patient   Free air and upper abdominal pain with distant history of gastritis.  Strongly suspicious for perforated duodenal ulcer. Plan:   Admit.  IV fluids.  IV antibiotic.  IV proton pump inhibitor.  Emergent abdominal exploration.  Given the fact he is not in shock nor massively distended, we will start with laparoscopic exploration.  Possible conversion to open.  I suspect is a perforated ulcer which will benefit from omental patching.  Another possibility is perforated diverticulitis  which may be possible given his history of constipation as well although seems less likely in a 29 year old.  Discussed with the patient at length with the brother-in-law and emergency room department nurse present.  Use of a phone interpreter.  Patient was tearful and emotional when I pointed out the seriousness of the situation but agrees with surgery to try and help save his life:  The anatomy & physiology of the digestive tract was discussed.  The pathophysiology of perforation was discussed.  Differential diagnosis such as perforated ulcer or colon, etc was discussed.   Natural history risks without surgery such as death was discussed.  I recommended abdominal exploration to diagnose & treat the source of the problem.  Laparoscopic & open techniques were discussed.   Risks such as bleeding, infection, abscess, leak, reoperation, bowel resection, possible ostomy, injury to other organs, need for repair of tissues / organs, hernia, heart attack, death, and other risks were discussed.   The risks of no intervention will lead to serious problems including death.   I expressed a good likelihood that surgery will address the problem.    Goals of post-operative recovery were discussed as well.  We will work to minimize complications although risks in an emergent setting are high.   Questions were answered.  The patient & brother in law expressed understanding & wishes to proceed with surgery.  The brother-in-law's discussed with the patient's sister as well whom agrees.       -VTE prophylaxis- SCDs, etc -mobilize as tolerated to help recovery    Adin Hector, M.D., F.A.C.S. Gastrointestinal and Minimally Invasive Surgery [facility] [address], [place], [place] [address] [phone] Main / Paging   08/15/2015  Note: Portions of this report may have been transcribed using voice recognition software. Every effort was made to ensure accuracy; however,  inadvertent computerized transcription errors may be present.   Any transcriptional errors that result from this process are unintentional.

## 2015-08-15 NOTE — Anesthesia Procedure Notes (Signed)
Procedure Name: Intubation Date/Time: 08/15/2015 7:25 AM Performed by: Anastasio Champion E Pre-anesthesia Checklist: Patient identified, Timeout performed, Emergency Drugs available, Suction available and Patient being monitored Patient Re-evaluated:Patient Re-evaluated prior to inductionOxygen Delivery Method: Circle system utilized Preoxygenation: Pre-oxygenation with 100% oxygen Intubation Type: IV induction Laryngoscope Size: Glidescope and 3 Grade View: Grade I Tube type: Oral Airway Equipment and Method: Stylet Placement Confirmation: ETT inserted through vocal cords under direct vision,  positive ETCO2 and CO2 detector Secured at: 22 cm Tube secured with: Tape (22) Dental Injury: Teeth and Oropharynx as per pre-operative assessment

## 2015-08-15 NOTE — Op Note (Signed)
08/15/2015  8:42 AM  PATIENT:  Ricardo Mckinney  29 y.o. male  No care team member to display  PRE-OPERATIVE DIAGNOSIS:  Free air  POST-OPERATIVE DIAGNOSIS:  ruptured ulcer  PROCEDURE:  Procedure(s): EXPLORATORY LAPAROTOMY with possible duodenal repair  SURGEON:  Surgeon(s): Karie Soda, MD  ASSISTANT: Darnell Level, MD   ANESTHESIA:   local and general  EBL:  Total I/O In: 1000 [I.V.:1000] Out: 800 [Urine:300; Other:500]  Delay start of Pharmacological VTE agent (>24hrs) due to surgical blood loss or risk of bleeding:  no  DRAINS: none   SPECIMEN:  No Specimen  DISPOSITION OF SPECIMEN:  N/A  COUNTS:  YES  PLAN OF CARE: Admit to inpatient   PATIENT DISPOSITION:  PACU - guarded condition.  INDICATION:   Patient with evidence of free air and perforation.  Suspicion of perforated ulcer.  I recommended surgery:  The anatomy & physiology of the digestive tract was discussed.  The pathophysiology of perforation was discussed.  Differential diagnosis such as perforated ulcer or colon, etc was discussed.   Natural history risks without surgery such as death was discussed.  I recommended abdominal exploration to diagnose & treat the source of the problem.  Laparoscopic & open techniques were discussed.   Risks such as bleeding, infection, abscess, leak, reoperation, bowel resection, possible ostomy, hernia, heart attack, death, and other risks were discussed.   The risks of no intervention will lead to serious problems including death.   I expressed a good likelihood that surgery will address the problem.    Goals of post-operative recovery were discussed as well.  We will work to minimize complications although risks in an emergent setting are high.   Questions were answered.  Spanish interpreter was used at length.  The patient & son in law expressed understanding & wishes to proceed with surgery.      OR FINDINGS:   Perforated ulcer at the duodenal bulb.  7mm punched out  classic appearance.  Pylorus and proximal duodenum and rather inflamed and thickened concerning for near obstruction.  Stomach very dilated concerning for partial gastric outlet obstruction.  Close to infundibulum of gallbladder.  Rather inflamed.  Omental patch done.    Moderate peritonitis but no major contamination.  Moderate brackish fluid in pelvis.  DESCRIPTION:   Informed consent was confirmed.  The patient underwent general anaesthesia without difficulty.  The patient was positioned appropriately.  VTE prevention in place.  The patient's abdomen was clipped, prepped, & draped in a sterile fashion.  Surgical timeout confirmed our plan.  The patient was positioned in reverse Trendelenburg.  Abdominal entry with a 5mm port was gained using optical entry technique in the left upper abdomen.  Entry was clean.  I induced carbon dioxide insufflation.  Camera inspection revealed no injury.  Extra ports were carefully placed under direct laparoscopic visualization.  Peritonitis could be seen.  We aspirated ascites.   We did copious irrigation to help clear up infected ascites and phlegmon.  We did careful examination of the entire abdominal cavity.  There is no frank evidence of appendicitis or diverticulitis.  No evidence of a perforated Meckel's diverticulum.  We carefully mobilized the omentum and small bowel, focusing along the stomach and duodenal sweep.  After mobilizing the upper abdomen, we could see the perforated ulcer at the duodenal bulb.  Anterior and superior just distal to the pylorus.  The proximal half the gallbladder was quite inflamed and the infundibulum was just left lateral of it nearby.  I mobilized a tongue of greater omentum.  I laid it over the perforated ulcer.  I proceeded to do an omental patch by placing 2-0 PDS stitches on either side of the ulcer x 2.  We tied that down to help tack the omentum as a plug of the perforation.  This is the classic Cheree Ditto omental patch.  This  provided a good watertight seal of the abscess.  We did copious irrigation and reinspection of the abdominal cavity.  All loculations were freed up.  It several liters until aspirate was much more clear.  I did a final irrigation of anabolic solution (clindamycin/gentamicin) same.  We then evacuated carbon dioxide to remove the ports.  I closed the left upper quadrant 12 mm port site fascia with 0 Vicryl suture 1.  Rest were 5 mm ports.  I closed the ports using Monocryl stitch a sterile dressings.  I am about to locate family to discuss intraoperative findings and postoperative care.  Ardeth Sportsman, M.D., F.A.C.S. Gastrointestinal and Minimally Invasive Surgery [facility] [address] [place], [place] [address] [phone] Main / Paging

## 2015-08-15 NOTE — ED Notes (Signed)
Nurse starting IV, will attempt to obtain labs then

## 2015-08-15 NOTE — ED Notes (Signed)
Bed: WU98 Expected date:  Expected time:  Means of arrival:  Comments: EMS 29yo M abd pain

## 2015-08-15 NOTE — ED Notes (Signed)
Patient is right upper quadrant pain that has been going on for 4-5 months. Patient stated it got worse tonight. Patient has not gone to the doctor.

## 2015-08-15 NOTE — Progress Notes (Addendum)
ANTIBIOTIC CONSULT NOTE - INITIAL  Pharmacy Consult for Zosyn Indication: perforated duodenal ulcer  Allergies  Allergen Reactions  . Nsaids Other (See Comments)    H/o perforated ulcer 08/15/2015    Patient Measurements: Height: 6' (182.9 cm) (Estimate) Weight: 194 lb 10.7 oz (88.3 kg) IBW/kg (Calculated) : 77.6  Vital Signs: Temp: 98.4 F (36.9 C) (10/11 1100) Temp Source: Oral (10/11 1100) BP: 98/52 mmHg (10/11 1100) Pulse Rate: 60 (10/11 1100) Intake/Output from previous day: 10/10 0701 - 10/11 0700 In: 50 [IV Piggyback:50] Out: -  Intake/Output from this shift: Total I/O In: 2725 [I.V.:2725] Out: 855 [Urine:350; Emesis/NG output:5; Other:500]  Labs:  Recent Labs  08/15/15 0134  WBC 10.8*  HGB 14.1  PLT 388  CREATININE 0.70   Estimated Creatinine Clearance: 152.2 mL/min (by C-G formula based on Cr of 0.7). No results for input(s): VANCOTROUGH, VANCOPEAK, VANCORANDOM, GENTTROUGH, GENTPEAK, GENTRANDOM, TOBRATROUGH, TOBRAPEAK, TOBRARND, AMIKACINPEAK, AMIKACINTROU, AMIKACIN in the last 72 hours.   Microbiology: No results found for this or any previous visit (from the past 720 hour(s)).  Medical History: Past Medical History  Diagnosis Date  . Gastritis     Dx in [place] ~2010  . Tobacco abuse 08/15/2015  . Constipation, chronic 08/15/2015    Assessment: 28 y/oM with PMH of gastritis, tobacco abuse, chronic constipation who presented with worsening abdominal pain, n/v. CT scan concerning for free intra-abdominal air and free fluid in the abdomen and pelvis, fluid present around pyloric region of stomach and duodenal bulb suggesting perforated ulcer. Patient underwent exploratory laparotomy with duodenal repair today. Pharmacy consulted to assist with dosing of Zosyn. Vancomycin and Zosyn x 1 given at 0511 this AM.   10/11 >> Vancomycin x 1 10/11 >> Zosyn >>    No cultures ordered Afebrile WBC mildly elevated SCr 0.7 with CrCl > 100 ml/min    Goal of  Therapy:  Appropriate antibiotic dosing for renal function and indication Eradication of infection  Plan:  Zosyn 3.375g IV q8h (infuse over 4 hours) Continue to monitor renal function, culture results as available, clinical course.   Greer Pickerel, PharmD, BCPS Pager: [phone] 08/15/2015 11:44 AM

## 2015-08-15 NOTE — ED Provider Notes (Signed)
CSN: [id number]     Arrival date & time 08/15/15  0100 History  By signing my name below, I, Freida Busman, attest that this documentation has been prepared under the direction and in the presence of Hutson Luft, MD . Electronically Signed: Freida Busman, Scribe. 08/15/2015. 2:00 AM.   Chief Complaint  Patient presents with  . Abdominal Pain    Patient is right upper quadrant pain that has been going on for 4-5 months. Patient stated it got worse tonight. Patient has not gone to the doctor.   LEVEL 5 CAVEAT DUE TO mental status  Patient is a 29 y.o. male presenting with abdominal pain. The history is provided by a friend and the EMS personnel. A language interpreter was used.  Abdominal Pain Pain location:  RUQ Pain radiates to:  Does not radiate Pain severity:  Severe Onset quality:  Gradual Timing:  Constant Progression:  Worsening (markedly worse x 36 hours) Chronicity:  Recurrent Context: not previous surgeries and not trauma   Relieved by:  Nothing Worsened by:  Nothing tried Ineffective treatments:  None tried Associated symptoms: vomiting   Risk factors: no recent hospitalization     HPI Comments:  Ricardo Mckinney is a 29 y.o. male brought in by ambulance, who presents to the Emergency Department complaining of RUQ abdominal pain for over 1 year. He noted to EMS the pain worsened tonight. Pt has associated nausea and vomiting.  He denies ETOH consumption. Patient is somewhat somnolent and preferring to sleep.  Following medications EDP spoke with patient has been told in the past even in [place] that he has gastritis.  Is on no meds.  Stopped drinking ETOH a year ago.  Does not use NSAIDs   History reviewed. No pertinent past medical history. History reviewed. No pertinent past surgical history. History reviewed. No pertinent family history. Social History  Substance Use Topics  . Smoking status: Current Every Day Smoker    Types: Cigarettes  . Smokeless tobacco: None   . Alcohol Use: Yes    Review of Systems  Gastrointestinal: Positive for vomiting and abdominal pain.  All other systems reviewed and are negative.  Allergies  Review of patient's allergies indicates no known allergies.  Home Medications   Prior to Admission medications   Medication Sig Start Date End Date Taking? Authorizing Provider  omeprazole (PRILOSEC) 20 MG capsule 1 bid x 15 d Patient not taking: Reported on 08/15/2015 03/26/14   Hayden Rasmussen, NP  ondansetron (ZOFRAN) 4 MG tablet Take 1 tablet (4 mg total) by mouth every 6 (six) hours. As needed for nausea and vomiting Patient not taking: Reported on 08/15/2015 03/26/14   Hayden Rasmussen, NP  sucralfate (CARAFATE) 1 GM/10ML suspension Take 10 mLs (1 g total) by mouth 4 (four) times daily -  with meals and at bedtime. Patient not taking: Reported on 08/15/2015 03/26/14   Hayden Rasmussen, NP   BP 114/57 mmHg  Pulse 81  Temp(Src) 98.1 F (36.7 C) (Oral)  Resp 17  SpO2 100% Physical Exam  Constitutional: He appears well-developed and well-nourished. No distress.  HENT:  Head: Normocephalic and atraumatic.  Mouth/Throat: Oropharynx is clear and moist. No oropharyngeal exudate.  Moist mucous membranes   Eyes: EOM are normal. Pupils are equal, round, and reactive to light.  Injected sclera   Neck: Normal range of motion. Neck supple. No JVD present.  Trachea midline No bruit  Cardiovascular: Normal rate, regular rhythm and normal heart sounds.   Pulmonary/Chest: Effort normal and breath  sounds normal. No respiratory distress. He has no wheezes. He has no rales.  Abdominal: Soft. Bowel sounds are normal. He exhibits no distension. There is tenderness (Epigastric). There is no rebound and no guarding.  No Murphy's sign  Musculoskeletal: Normal range of motion. He exhibits no edema.  Neurological: He is alert. He has normal reflexes.  Skin: Skin is warm and dry.  Psychiatric: He has a normal mood and affect. His behavior is normal.   Nursing note and vitals reviewed.   ED Course  Procedures   DIAGNOSTIC STUDIES:  Oxygen Saturation is 98% on RA, normal by my interpretation.     Labs Review Labs Reviewed  COMPREHENSIVE METABOLIC PANEL - Abnormal; Notable for the following:    Potassium 3.2 (*)    Glucose, Bld 161 (*)    ALT 16 (*)    All other components within normal limits  CBC - Abnormal; Notable for the following:    WBC 10.8 (*)    All other components within normal limits  URINALYSIS, ROUTINE W REFLEX MICROSCOPIC (NOT AT [facility]) - Abnormal; Notable for the following:    Color, Urine AMBER (*)    APPearance TURBID (*)    All other components within normal limits  URINE RAPID DRUG SCREEN, HOSP PERFORMED - Abnormal; Notable for the following:    Amphetamines POSITIVE (*)    All other components within normal limits  LIPASE, BLOOD  ETHANOL  URINE MICROSCOPIC-ADD ON    Imaging Review No results found. I have personally reviewed and evaluated these  lab results as part of my medical decision-making.   EKG Interpretation None      MDM   Final diagnoses:  None    Patient easily arousible.  Spoke at length with patient regarding his condition and imaging via the interpretor phone.   Results for orders placed or performed during the hospital encounter of 08/15/15  Lipase, blood  Result Value Ref Range   Lipase 24 22 - 51 U/L  Comprehensive metabolic panel  Result Value Ref Range   Sodium 137 135 - 145 mmol/L   Potassium 3.2 (L) 3.5 - 5.1 mmol/L   Chloride 101 101 - 111 mmol/L   CO2 28 22 - 32 mmol/L   Glucose, Bld 161 (H) 65 - 99 mg/dL   BUN 9 6 - 20 mg/dL   Creatinine, Ser 1.61 0.61 - 1.24 mg/dL   Calcium 9.3 8.9 - 09.6 mg/dL   Total Protein 7.3 6.5 - 8.1 g/dL   Albumin 4.1 3.5 - 5.0 g/dL   AST 24 15 - 41 U/L   ALT 16 (L) 17 - 63 U/L   Alkaline Phosphatase 70 38 - 126 U/L   Total Bilirubin 0.8 0.3 - 1.2 mg/dL   GFR calc non Af Amer >60 >60 mL/min   GFR calc Af Amer >60 >60 mL/min    Anion gap 8 5 - 15  CBC  Result Value Ref Range   WBC 10.8 (H) 4.0 - 10.5 K/uL   RBC 4.56 4.22 - 5.81 MIL/uL   Hemoglobin 14.1 13.0 - 17.0 g/dL   HCT 04.5 40.9 - 81.1 %   MCV 86.4 78.0 - 100.0 fL   MCH 30.9 26.0 - 34.0 pg   MCHC 35.8 30.0 - 36.0 g/dL   RDW 91.4 78.2 - 95.6 %   Platelets 388 150 - 400 K/uL  Urinalysis, Routine w reflex microscopic (not at [facility])  Result Value Ref Range   Color, Urine AMBER (A) YELLOW  APPearance TURBID (A) CLEAR   Specific Gravity, Urine 1.020 1.005 - 1.030   pH 8.0 5.0 - 8.0   Glucose, UA NEGATIVE NEGATIVE mg/dL   Hgb urine dipstick NEGATIVE NEGATIVE   Bilirubin Urine NEGATIVE NEGATIVE   Ketones, ur NEGATIVE NEGATIVE mg/dL   Protein, ur NEGATIVE NEGATIVE mg/dL   Urobilinogen, UA 1.0 0.0 - 1.0 mg/dL   Nitrite NEGATIVE NEGATIVE   Leukocytes, UA NEGATIVE NEGATIVE  Ethanol  Result Value Ref Range   Alcohol, Ethyl (B) <5 <5 mg/dL  Urine rapid drug screen (hosp performed)  Result Value Ref Range   Opiates NONE DETECTED NONE DETECTED   Cocaine NONE DETECTED NONE DETECTED   Benzodiazepines NONE DETECTED NONE DETECTED   Amphetamines POSITIVE (A) NONE DETECTED   Tetrahydrocannabinol NONE DETECTED NONE DETECTED   Barbiturates NONE DETECTED NONE DETECTED  Urine microscopic-add on  Result Value Ref Range   WBC, UA 0-2 <3 WBC/hpf   Bacteria, UA FEW (A) RARE   Urine-Other MUCOUS PRESENT    Ct Abdomen Pelvis W Contrast  08/15/2015   CLINICAL DATA:  Right upper quadrant pain for 5 months, worse tonight. Nausea. No known injury. Elevated white cell count. Free air demonstrated on abdominal radiographs.  EXAM: CT ABDOMEN AND PELVIS WITH CONTRAST  TECHNIQUE: Multidetector CT imaging of the abdomen and pelvis was performed using the standard protocol following bolus administration of intravenous contrast.  CONTRAST:  OMNIPAQUE IOHEXOL 300 MG/ML  SOLN  COMPARISON:  Abdomen 08/15/2015  FINDINGS: Lung bases are clear.  There is free intra-abdominal air  demonstrated mostly in the upper abdomen and beneath the anterior hemidiaphragms. Small gas bubbles are present in the porta hepatis. There is a small amount of free fluid demonstrated around the gastric pylorus and duodenal bulb region. Bowel wall is mildly thickened in this area. Free fluid also tracks along the pericolic gutters into the pelvis. Fluid attenuation is mildly increased. Appearance is likely due to peptic ulcer disease involving the pylorus and/or duodenal bulb with associated perforation. No pneumatosis or portal venous gas is identified.  Cholelithiasis with contracted gallbladder. No gallbladder wall thickening. No bile duct dilatation. Liver, spleen, pancreas, adrenal glands, kidneys, abdominal aorta, inferior vena cava, and retroperitoneal lymph nodes are unremarkable. Small bowel and colon are not abnormally distended. Stool in the colon.  Pelvis: Appendix is not identified. Bladder is decompressed. Prostate gland is not enlarged. No pelvic mass or lymphadenopathy. No destructive bone lesions.  IMPRESSION: Free intra-abdominal air and free fluid in the abdomen and pelvis. Fluid is present around the pyloric region of the stomach and the duodenal bulb. This suggests probable perforating ulcer as cause of free air and free fluid. Cholelithiasis.  These results were called by telephone at the time of interpretation on 08/15/2015 at 4:33 am to Dr. Cy Blamer , who verbally acknowledged these results.   Electronically Signed   By: Burman Nieves M.D.   On: 08/15/2015 04:36   Dg Abd Acute W/chest  08/15/2015   CLINICAL DATA:  Initial evaluation for 1 year history of right upper quadrant pain, worse this morning.  EXAM: DG ABDOMEN ACUTE W/ 1V CHEST  COMPARISON:  Prior radiograph from 03/26/2014.  FINDINGS: Cardiac and mediastinal silhouettes are within normal limits.  Lungs are normally inflated. No focal infiltrate, pulmonary edema, or pleural effusion. No pneumothorax.  Bowel gas pattern  within normal limits without evidence of obstruction or ileus. Moderate amount retained stool within the colon. On upright projection, there is gas lucency subjacent to the  right hemidiaphragm, suspicious for small amount of free air. This is seen on both upright chest and upright abdominal films.  No soft tissue mass or abnormal calcification.  No acute osseus abnormality.  IMPRESSION: 1. Small amount of gas lucency subjacent to the right hemidiaphragm, concerning for free intraperitoneal air. Further evaluation with dedicated cross-sectional imaging of the abdomen and pelvis recommended. 2. Nonobstructive bowel gas pattern. 3. No active cardiopulmonary disease. Critical Value/emergent results were called by telephone at the time of interpretation on 08/15/2015 at 3:43 am to Dr. Cy Blamer , who verbally acknowledged these results.   Electronically Signed   By: Rise Mu M.D.   On: 08/15/2015 03:45   Medications  vancomycin (VANCOCIN) IVPB 1000 mg/200 mL premix (1,000 mg Intravenous New Bag/Given 08/15/15 0511)  piperacillin-tazobactam (ZOSYN) IVPB 3.375 g (3.375 g Intravenous New Bag/Given 08/15/15 0511)  lactated ringers bolus 1,000 mL (not administered)  lactated ringers bolus 1,000 mL (not administered)  ondansetron (ZOFRAN) injection 4 mg (4 mg Intravenous Given 08/15/15 0134)  sodium chloride 0.9 % bolus 1,000 mL (0 mLs Intravenous Stopped 08/15/15 0431)  pantoprazole (PROTONIX) injection 40 mg (40 mg Intravenous Given 08/15/15 0431)  iohexol (OMNIPAQUE) 300 MG/ML solution 100 mL (100 mLs Intravenous Contrast Given 08/15/15 0409)  sodium chloride 0.9 % bolus 2,000 mL (2,000 mLs Intravenous New Bag/Given 08/15/15 0510)   MDM Reviewed: previous chart, nursing note and vitals Interpretation: labs (normal hgb no ETOH.  ct by me free air with air in the biliary tree and fluid in the pelvic XR by me free air lungs clear) Total time providing critical care: 30-74 minutes. This excludes  time spent performing separately reportable procedures and services. Consults: general surgery (Drs. Andria Meuse and Phill Myron of radiology)  CRITICAL CARE Performed by: Jasmine Awe Total critical care time: 60 minutes  Critical care time was exclusive of separately billable procedures and treating other patients. Critical care was necessary to treat or prevent imminent or life-threatening deterioration. Critical care was time spent personally by me on the following activities: development of treatment plan with patient and/or surrogate as well as nursing, discussions with consultants, evaluation of patient's response to treatment, examination of patient, obtaining history from patient or surrogate, ordering and performing treatments and interventions, ordering and review of laboratory studies, ordering and review of radiographic studies, pulse oximetry and re-evaluation of patient's condition.     I, Shakeera Rightmyer-RASCH,Kimara Bencomo K, personally performed the services described in this documentation. All medical record entries made by the scribe were at my direction and in my presence.  I have reviewed the chart and discharge instructions and agree that the record reflects my personal performance and is accurate and complete. Efe Fazzino-RASCH,Peter Keyworth K.  08/15/2015. 5:39 AM.      Cassandre Oleksy, MD 08/15/15 [phone]

## 2015-08-15 NOTE — ED Notes (Signed)
Report given to OR. Fannie Knee)

## 2015-08-15 NOTE — Anesthesia Preprocedure Evaluation (Signed)
Anesthesia Evaluation  Patient identified by MRN, date of birth, ID band Patient awake    Reviewed: Allergy & Precautions, NPO status , Patient's Chart, lab work & pertinent test results  Airway Mallampati: II   Neck ROM: full    Dental   Pulmonary Current Smoker,    breath sounds clear to auscultation       Cardiovascular negative cardio ROS   Rhythm:regular Rate:Normal     Neuro/Psych    GI/Hepatic PUD, Free air.   Endo/Other    Renal/GU      Musculoskeletal   Abdominal   Peds  Hematology   Anesthesia Other Findings   Reproductive/Obstetrics                             Anesthesia Physical Anesthesia Plan  ASA: II and emergent  Anesthesia Plan: General   Post-op Pain Management:    Induction: Intravenous, Rapid sequence and Cricoid pressure planned  Airway Management Planned: Oral ETT  Additional Equipment:   Intra-op Plan:   Post-operative Plan: Extubation in OR  Informed Consent: I have reviewed the patients History and Physical, chart, labs and discussed the procedure including the risks, benefits and alternatives for the proposed anesthesia with the patient or authorized representative who has indicated his/her understanding and acceptance.     Plan Discussed with: CRNA, Anesthesiologist and Surgeon  Anesthesia Plan Comments:         Anesthesia Quick Evaluation

## 2015-08-15 NOTE — ED Notes (Signed)
Brother in law Sheliah Plane [phone] for Emergency Contact

## 2015-08-15 NOTE — Anesthesia Postprocedure Evaluation (Signed)
Anesthesia Post Note  Patient: Ricardo Mckinney  Procedure(s) Performed: Procedure(s) (LRB): EXPLORATORY LAPAROTOMY duodenal perforation repair (N/A)  Anesthesia type: General  Patient location: PACU  Post pain: Pain level controlled and Adequate analgesia  Post assessment: Post-op Vital signs reviewed, Patient's Cardiovascular Status Stable, Respiratory Function Stable, Patent Airway and Pain level controlled  Last Vitals:  Filed Vitals:   08/15/15 0930  BP: 104/58  Pulse: 64  Temp:   Resp: 17    Post vital signs: Reviewed and stable  Level of consciousness: awake, alert  and oriented  Complications: No apparent anesthesia complications

## 2015-08-15 NOTE — Transfer of Care (Signed)
Immediate Anesthesia Transfer of Care Note  Patient: Ricardo Mckinney  Procedure(s) Performed: Procedure(s): EXPLORATORY LAPAROTOMY with possible duodenal repair (N/A)  Patient Location: PACU  Anesthesia Type:General  Level of Consciousness: awake, alert , oriented and patient cooperative  Airway & Oxygen Therapy: Patient Spontanous Breathing and Patient connected to face mask oxygen  Post-op Assessment: Report given to RN, Post -op Vital signs reviewed and stable and Patient moving all extremities X 4  Post vital signs: stable  Last Vitals:  Filed Vitals:   08/15/15 0423  BP: 109/64  Pulse: 80  Temp:   Resp: 20    Complications: No apparent anesthesia complications

## 2015-08-16 LAB — CBC
HCT: 33.1 % — ABNORMAL LOW (ref 39.0–52.0)
Hemoglobin: 11.7 g/dL — ABNORMAL LOW (ref 13.0–17.0)
MCH: 31.4 pg (ref 26.0–34.0)
MCHC: 35.3 g/dL (ref 30.0–36.0)
MCV: 88.7 fL (ref 78.0–100.0)
PLATELETS: 346 10*3/uL (ref 150–400)
RBC: 3.73 MIL/uL — AB (ref 4.22–5.81)
RDW: 13.3 % (ref 11.5–15.5)
WBC: 20.7 10*3/uL — ABNORMAL HIGH (ref 4.0–10.5)

## 2015-08-16 LAB — BASIC METABOLIC PANEL
ANION GAP: 8 (ref 5–15)
BUN: 9 mg/dL (ref 6–20)
CO2: 27 mmol/L (ref 22–32)
CREATININE: 0.7 mg/dL (ref 0.61–1.24)
Calcium: 8.8 mg/dL — ABNORMAL LOW (ref 8.9–10.3)
Chloride: 101 mmol/L (ref 101–111)
GFR calc non Af Amer: >60 mL/min (ref >60)
Glucose, Bld: 115 mg/dL — ABNORMAL HIGH (ref 65–99)
Potassium: 3.9 mmol/L (ref 3.5–5.1)
SODIUM: 136 mmol/L (ref 135–145)

## 2015-08-16 NOTE — Progress Notes (Signed)
Patient ID: Ricardo Mckinney, male   DOB: [birth year], 29 y.o.   MRN: [id number]  General Surgery [facility]  POD#: 1  Subjective: Patient wants NG tube out, denies abdominal pain.  Family at bedside.  Objective: Vital signs in last 24 hours: Temp:  [97.6 F (36.4 C)-98.8 F (37.1 C)] 98.3 F (36.8 C) (10/12 0626) Pulse Rate:  [53-71] 56 (10/12 0626) Resp:  [18-19] 18 (10/12 0626) BP: (95-109)/(48-56) 109/56 mmHg (10/12 0626) SpO2:  [99 %-100 %] 100 % (10/12 0626) Last BM Date: 08/14/15  Intake/Output from previous day: 10/11 0701 - 10/12 0700 In: 4150 [I.V.:4100; IV Piggyback:50] Out: 4355 [Urine:3850; Emesis/NG output:5] Intake/Output this shift: Total I/O In: 120 [NG/GT:120] Out: -   Physical Exam: HEENT - sclerae clear, mucous membranes moist Neck - soft Chest - clear bilaterally Cor - RRR Abdomen - soft without distension, rare BS present; dressings dry and intact; NG with minimal output Ext - no edema, non-tender Neuro - alert & oriented, no focal deficits  Lab Results:   Recent Labs  08/15/15 0134 08/16/15 0542  WBC 10.8* 20.7*  HGB 14.1 11.7*  HCT 39.4 33.1*  PLT 388 346   BMET  Recent Labs  08/15/15 0134 08/16/15 0542  NA 137 136  K 3.2* 3.9  CL 101 101  CO2 28 27  GLUCOSE 161* 115*  BUN 9 9  CREATININE 0.70 0.70  CALCIUM 9.3 8.8*   PT/INR No results for input(s): LABPROT, INR in the last 72 hours. Comprehensive Metabolic Panel:    Component Value Date/Time   NA 136 08/16/2015 0542   NA 137 08/15/2015 0134   K 3.9 08/16/2015 0542   K 3.2* 08/15/2015 0134   CL 101 08/16/2015 0542   CL 101 08/15/2015 0134   CO2 27 08/16/2015 0542   CO2 28 08/15/2015 0134   BUN 9 08/16/2015 0542   BUN 9 08/15/2015 0134   CREATININE 0.70 08/16/2015 0542   CREATININE 0.70 08/15/2015 0134   GLUCOSE 115* 08/16/2015 0542   GLUCOSE 161* 08/15/2015 0134   CALCIUM 8.8* 08/16/2015 0542   CALCIUM 9.3 08/15/2015 0134   AST 24  08/15/2015 0134   ALT 16* 08/15/2015 0134   ALKPHOS 70 08/15/2015 0134   BILITOT 0.8 08/15/2015 0134   PROT 7.3 08/15/2015 0134   ALBUMIN 4.1 08/15/2015 0134    Studies/Results: Ct Abdomen Pelvis W Contrast  08/15/2015  CLINICAL DATA:  Right upper quadrant pain for 5 months, worse tonight. Nausea. No known injury. Elevated white cell count. Free air demonstrated on abdominal radiographs. EXAM: CT ABDOMEN AND PELVIS WITH CONTRAST TECHNIQUE: Multidetector CT imaging of the abdomen and pelvis was performed using the standard protocol following bolus administration of intravenous contrast. CONTRAST:  OMNIPAQUE IOHEXOL 300 MG/ML  SOLN COMPARISON:  Abdomen 08/15/2015 FINDINGS: Lung bases are clear. There is free intra-abdominal air demonstrated mostly in the upper abdomen and beneath the anterior hemidiaphragms. Small gas bubbles are present in the porta hepatis. There is a small amount of free fluid demonstrated around the gastric pylorus and duodenal bulb region. Bowel wall is mildly thickened in this area. Free fluid also tracks along the pericolic gutters into the pelvis. Fluid attenuation is mildly increased. Appearance is likely due to peptic ulcer disease involving the pylorus and/or duodenal bulb with associated perforation. No pneumatosis or portal venous gas is identified. Cholelithiasis with contracted gallbladder. No gallbladder wall thickening. No bile duct dilatation. Liver, spleen, pancreas, adrenal glands, kidneys, abdominal aorta, inferior vena  cava, and retroperitoneal lymph nodes are unremarkable. Small bowel and colon are not abnormally distended. Stool in the colon. Pelvis: Appendix is not identified. Bladder is decompressed. Prostate gland is not enlarged. No pelvic mass or lymphadenopathy. No destructive bone lesions. IMPRESSION: Free intra-abdominal air and free fluid in the abdomen and pelvis. Fluid is present around the pyloric region of the stomach and the duodenal bulb. This  suggests probable perforating ulcer as cause of free air and free fluid. Cholelithiasis. These results were called by telephone at the time of interpretation on 08/15/2015 at 4:33 am to Dr. Cy BlamerAPRIL PALUMBO , who verbally acknowledged these results. Electronically Signed   By: Burman NievesWilliam  Stevens M.D.   On: 08/15/2015 04:36   Dg Abd Acute W/chest  08/15/2015  CLINICAL DATA:  Initial evaluation for 1 year history of right upper quadrant pain, worse this morning. EXAM: DG ABDOMEN ACUTE W/ 1V CHEST COMPARISON:  Prior radiograph from 03/26/2014. FINDINGS: Cardiac and mediastinal silhouettes are within normal limits. Lungs are normally inflated. No focal infiltrate, pulmonary edema, or pleural effusion. No pneumothorax. Bowel gas pattern within normal limits without evidence of obstruction or ileus. Moderate amount retained stool within the colon. On upright projection, there is gas lucency subjacent to the right hemidiaphragm, suspicious for small amount of free air. This is seen on both upright chest and upright abdominal films. No soft tissue mass or abnormal calcification. No acute osseus abnormality. IMPRESSION: 1. Small amount of gas lucency subjacent to the right hemidiaphragm, concerning for free intraperitoneal air. Further evaluation with dedicated cross-sectional imaging of the abdomen and pelvis recommended. 2. Nonobstructive bowel gas pattern. 3. No active cardiopulmonary disease. Critical Value/emergent results were called by telephone at the time of interpretation on 08/15/2015 at 3:43 am to Dr. Cy BlamerAPRIL PALUMBO , who verbally acknowledged these results. Electronically Signed   By: Rise MuBenjamin  McClintock M.D.   On: 08/15/2015 03:45    Anti-infectives: Anti-infectives    Start     Dose/Rate Route Frequency Ordered Stop   08/15/15 1100  piperacillin-tazobactam (ZOSYN) IVPB 3.375 g     3.375 g 12.5 mL/hr over 240 Minutes Intravenous 3 times per day 08/15/15 1046     08/15/15 0630  clindamycin (CLEOCIN) 900  mg, gentamicin (GARAMYCIN) 240 mg in sodium chloride 0.9 % 1,000 mL for intraperitoneal lavage  Status:  Discontinued    Comments:  Pharmacy may adjust dosing strength, schedule, rate of infusion, etc as needed to optimize therapy    Intraperitoneal To Surgery 08/15/15 0559 08/15/15 1014   08/15/15 0615  piperacillin-tazobactam (ZOSYN) IVPB 3.375 g  Status:  Discontinued     3.375 g 100 mL/hr over 30 Minutes Intravenous STAT 08/15/15 0608 08/15/15 1046   08/15/15 0445  vancomycin (VANCOCIN) IVPB 1000 mg/200 mL premix     1,000 mg 200 mL/hr over 60 Minutes Intravenous  Once 08/15/15 0431 08/15/15 0611   08/15/15 0445  piperacillin-tazobactam (ZOSYN) IVPB 3.375 g     3.375 g 100 mL/hr over 30 Minutes Intravenous  Once 08/15/15 0431 08/15/15 0549      Assessment & Plans: Status post laparoscopic Cheree DittoGraham patch closure of perforated pyloric channel ulcer  IV Protonix  Discontinue NG tube  Continue NPO today  OOB, ambulate  H. Pylori testing  Velora Hecklerodd M. Darcee Dekker, MD, [facility] Office: [phone]   Ricardo Mckinney 08/16/2015

## 2015-08-17 ENCOUNTER — Inpatient Hospital Stay (HOSPITAL_COMMUNITY): Payer: Self-pay

## 2015-08-17 LAB — H. PYLORI ANTIBODY, IGG: H Pylori IgG: 2.9 U/mL — ABNORMAL HIGH (ref 0.0–0.8)

## 2015-08-17 MED ORDER — POTASSIUM CHLORIDE IN NACL 20-0.9 MEQ/L-% IV SOLN
INTRAVENOUS | Status: DC
  Administered 2015-08-17: 100 mL/h via INTRAVENOUS
  Filled 2015-08-17 (×4): qty 1000

## 2015-08-17 MED ORDER — IOHEXOL 300 MG/ML  SOLN
150.0000 mL | Freq: Once | INTRAMUSCULAR | Status: DC | PRN
  Administered 2015-08-17: 450 mL via ORAL
  Filled 2015-08-17: qty 150

## 2015-08-17 NOTE — Progress Notes (Signed)
2 Days Post-Op  Subjective: He seems to be doing well, no real complaints, I used the phone interpreter to discuss plan.   Objective: Vital signs in last 24 hours: Temp:  [98 F (36.7 C)-99.6 F (37.6 C)] 99.3 F (37.4 C) (10/13 1352) Pulse Rate:  [77-101] 87 (10/13 1352) Resp:  [17-18] 18 (10/13 1000) BP: (96-124)/(43-60) 98/51 mmHg (10/13 1352) SpO2:  [96 %-100 %] 96 % (10/13 1000) Last BM Date: 08/14/15 NPO Afebrile, VSS WBC still up yesterday Films:  Plan gastrografin UGI today:  Gastric ulcer with surrounding edema in the distal antrum. No evidence of contrast leak or extravasation.  H pylori is positive Intake/Output from previous day: 10/12 0701 - 10/13 0700 In: 2620 [I.V.:2500; NG/GT:120] Out: 0  Intake/Output this shift: Total I/O In: 0  Out: 900 [Urine:900]  General appearance: alert, cooperative and no distress Resp: clear to auscultation bilaterally GI: soft sore, sites look fine.  + BS, some flatus  Lab Results:   Recent Labs  08/15/15 0134 08/16/15 0542  WBC 10.8* 20.7*  HGB 14.1 11.7*  HCT 39.4 33.1*  PLT 388 346    BMET  Recent Labs  08/15/15 0134 08/16/15 0542  NA 137 136  K 3.2* 3.9  CL 101 101  CO2 28 27  GLUCOSE 161* 115*  BUN 9 9  CREATININE 0.70 0.70  CALCIUM 9.3 8.8*   PT/INR No results for input(s): LABPROT, INR in the last 72 hours.   Recent Labs Lab 08/15/15 0134  AST 24  ALT 16*  ALKPHOS 70  BILITOT 0.8  PROT 7.3  ALBUMIN 4.1     Lipase     Component Value Date/Time   LIPASE 24 08/15/2015 0134     Studies/Results: Dg Ugi W/water Sol Cm  08/17/2015  CLINICAL DATA:  Two days postop from surgical repair of perforated duodenal ulcer. EXAM: WATER SOLUBLE UPPER GI SERIES TECHNIQUE: Single-column upper GI series was performed using water soluble contrast. CONTRAST:  OMNIPAQUE IOHEXOL 300 MG/ML  SOLN COMPARISON:  None. FLUOROSCOPY TIME:  Fluoroscopy Time (in minutes and seconds): 2 minutes 40 seconds  Number of Acquired Images:  15 FINDINGS: The scout radiograph shows a normal bowel gas pattern. No evidence esophageal mass or obstruction. No evidence hiatal hernia. Wall thickening is seen involving the distal gastric body and antrum. Ulcer with surrounding mound of edema is seen in the gastric antrum. There is no evidence of contrast leak or extravasation. Mildly delayed gastric emptying is noted, however the duodenum is nondilated and normal in appearance. IMPRESSION: Gastric ulcer with surrounding edema in the distal antrum. No evidence of contrast leak or extravasation. Consider endoscopy for further evaluation. Mildly delayed gastric emptying. No evidence of gastric outlet obstruction Electronically Signed   By: Myles Rosenthal M.D.   On: 08/17/2015 13:28    Medications: . antiseptic oral rinse  7 mL Mouth Rinse BID  . enoxaparin (LOVENOX) injection  40 mg Subcutaneous Q24H  . lip balm  1 application Topical BID  . pantoprazole (PROTONIX) IV  80 mg Intravenous Q12H  . piperacillin-tazobactam (ZOSYN)  IV  3.375 g Intravenous 3 times per day    Assessment/Plan Perforated duodenal ulcer S/p exploratory laparotomy with possible duodenal repair, 08/15/15, Dr. Star Age Antibiotics:  Day 3 Zosyn DVT:  Lovenox/SCD   Plan:  UGI this AM and see if we can get him on some liquids later today.  H pylori is Positive. UGI shows no leak, but some ongoing edema in the distal antrum.  No outlet obstruction.  I will start him on some clears from the floor and see how he does.  He will need triple therapy PPI, clairthromycin, Amoxicillin for 14 days after he switches to PO meds.   LOS: 2 days    Donell Tomkins 08/17/2015

## 2015-08-18 LAB — BASIC METABOLIC PANEL
Anion gap: 5 (ref 5–15)
BUN: 7 mg/dL (ref 6–20)
CHLORIDE: 105 mmol/L (ref 101–111)
CO2: 26 mmol/L (ref 22–32)
Calcium: 8.4 mg/dL — ABNORMAL LOW (ref 8.9–10.3)
Creatinine, Ser: 0.67 mg/dL (ref 0.61–1.24)
GFR calc non Af Amer: >60 mL/min (ref >60)
Glucose, Bld: 88 mg/dL (ref 65–99)
POTASSIUM: 3.7 mmol/L (ref 3.5–5.1)
SODIUM: 136 mmol/L (ref 135–145)

## 2015-08-18 LAB — CBC
HEMATOCRIT: 30.8 % — AB (ref 39.0–52.0)
HEMOGLOBIN: 10.8 g/dL — AB (ref 13.0–17.0)
MCH: 31.2 pg (ref 26.0–34.0)
MCHC: 35.1 g/dL (ref 30.0–36.0)
MCV: 89 fL (ref 78.0–100.0)
Platelets: 306 10*3/uL (ref 150–400)
RBC: 3.46 MIL/uL — AB (ref 4.22–5.81)
RDW: 13 % (ref 11.5–15.5)
WBC: 10.6 10*3/uL — ABNORMAL HIGH (ref 4.0–10.5)

## 2015-08-18 MED ORDER — CLARITHROMYCIN 500 MG PO TABS
500.0000 mg | ORAL_TABLET | Freq: Two times a day (BID) | ORAL | Status: DC
  Administered 2015-08-18 – 2015-08-19 (×3): 500 mg via ORAL
  Filled 2015-08-18 (×4): qty 1

## 2015-08-18 MED ORDER — SACCHAROMYCES BOULARDII 250 MG PO CAPS
250.0000 mg | ORAL_CAPSULE | Freq: Two times a day (BID) | ORAL | Status: DC
  Administered 2015-08-18 – 2015-08-19 (×3): 250 mg via ORAL
  Filled 2015-08-18 (×4): qty 1

## 2015-08-18 MED ORDER — PANTOPRAZOLE SODIUM 40 MG PO TBEC
40.0000 mg | DELAYED_RELEASE_TABLET | Freq: Two times a day (BID) | ORAL | Status: DC
  Administered 2015-08-18 – 2015-08-19 (×2): 40 mg via ORAL
  Filled 2015-08-18 (×3): qty 1

## 2015-08-18 MED ORDER — OXYCODONE-ACETAMINOPHEN 5-325 MG PO TABS: 1.0000 | ORAL_TABLET | ORAL | Status: DC | PRN

## 2015-08-18 NOTE — Discharge Instructions (Signed)
Es necesario estar en inhibidores de la bomba de protones anticido para el resto de su vida para sanar su lcera.  lcera pptica  (Peptic Ulcer)  La lcera pptica es una llaga dolorosa en la membrana que recubre el esfago (lcera esofgica), el estmago (lcera gstrica), o la primera parte del intestino delgado (lcera duodenal). La lcera causa erosin en los tejidos profundos.  CAUSAS  Normalmente, el revestimiento del estmago y del intestino delgado se protegen a s mismos del cido con que se digieren los alimentos. El revestimiento protector puede daarse debido a:   Una infeccin causada por una bacteria llamada Helicobacter pylori (H. pylori).  El uso regular de medicamentos anti-inflamatorios no esteroides (AINE), como el ibuprofeno o la aspirina.  El consumo de tabaco. Otros factores de riesgo incluyen ser mayor de 50 aos, el consumo de alcohol en exceso y Wilburt Finlaytener antecedentes familiares de lcera.  SNTOMAS   Dolor quemante o punzante en la zona entre el pecho y el ombligo.  Acidez.  Nuseas y vmitos.  Hinchazn. El dolor empeora con el estmago vaco y por la noche. Si la lcera sangra, puede causar:   Materia fecal de color negro alquitranado.  Vmito de sangre roja brillante.  Vmito de aspecto similar a la borra del caf. DIAGNSTICO  El diagnstico se realiza basndose en la historia clnica y el examen fsico. Para encontrar las causas de la lcera podrn indicarle otros estudios y procedimientos. Encontrar la causa ayudar a Futures traderdeterminar el mejor tratamiento. Los estudios y procedimientos pueden incluir:   Anlisis de sangre, anlisis de materia fecal, o estudios del aliento para Engineer, manufacturingdetectar la bacteria H. pylori.  Una seriada del tracto gastrointestinal (GI) del esfago, el estmago y el intestino delgado.  Una endoscopia para examinar el esfago, el estmago y el intestino delgado.  Una biopsia. TRATAMIENTO  El tratamiento incluye:   La eliminacin de la  causa de la lcera, como el tabaquismo, los [place] o el alcohol.  Medicamentos para reducir la cantidad de cido en el tracto digestivo.  Antibiticos si la causa de la lcera es la bacteria H. pylori.  Una endoscopia superior para tratar Rolan Lipauna lcera sangrante.  Ciruga si el sangrado es grave o si la lcera ha perforado Event organiseralgn lugar en el sistema digestivo. INSTRUCCIONES PARA EL CUIDADO EN EL HOGAR   Evite el tabaco, el alcohol y la cafena. El fumar puede aumentar el cido en el estmago y el tabaquismo continuado no favorecer la curacin de las lceras.  Evite los alimentos y las bebidas que le parece que le causan molestias o que le agravan su lcera.  Tome slo la medicacin que le indic el profesional. No tome sustitutos de venta libre de los medicamentos recetados sin Science writerconsultar a su mdico.  Cumpla con las consultas de control y hgase los estudios segn las indicaciones. SOLICITE ATENCIN MDICA SI:   La infeccin no mejora dentro de los [address]  [address]das despus de Programmer, systemsiniciar el tratamiento.  Siente indigestin o [place]acidez de manera continua. SOLICITE ATENCIN MDICA DE INMEDIATO SI:   Siente un dolor repentino y agudo o persistente en el abdomen.  La materia fecal es sanguinolenta o negra, de aspecto alquitranado.  Vomita sangre o el vmito tiene el aspecto similar a la borra del caf.  Si se siente mareado, dbil o que va a desmayarse.  Se siente transpirado o sudoroso. ASEGRESE DE QUE:   Comprende estas instrucciones.  Controlar su enfermedad.  Solicitar ayuda de inmediato si no mejora o si empeora.  Esta informacin no tiene Theme park manager el consejo del mdico. Asegrese de hacerle al mdico cualquier pregunta que tenga.   Document Released: 07/31/2005 Document Revised: 07/15/2012 Elsevier Interactive Patient Education 2016 Elsevier Inc.  LAPAROSCOPIC SURGERY: POST OP INSTRUCTIONS  1. DIET: Follow a light bland diet the first 24 hours after arrival home, such as  soup, liquids, crackers, etc.  Be sure to include lots of fluids daily.  Avoid fast food or heavy meals as your are more likely to get nauseated.  Eat a low fat the next few days after surgery.   2. Take your usually prescribed home medications unless otherwise directed. 3. PAIN CONTROL: a. Pain is best controlled by a usual combination of three different methods TOGETHER: i. Ice/Heat ii. Over the counter pain medication iii. Prescription pain medication b. Most patients will experience some swelling and bruising around the incisions.  Ice packs or heating pads (30-60 minutes up to 6 times a day) will help. Use ice for the first few days to help decrease swelling and bruising, then switch to heat to help relax tight/sore spots and speed recovery.  Some people prefer to use ice alone, heat alone, alternating between ice & heat.  Experiment to what works for you.  Swelling and bruising can take several weeks to resolve.   c. It is helpful to take an over-the-counter pain medication regularly for the first few weeks.  Choose one of the following that works best for you: i. Naproxen (Aleve, etc)  Two 220mg  tabs twice a day ii. Ibuprofen (Advil, etc) Three 200mg  tabs four times a day (every meal & bedtime) iii. Acetaminophen (Tylenol, etc) 500-650mg  four times a day (every meal & bedtime) d. A  prescription for pain medication (such as oxycodone, hydrocodone, etc) should be given to you upon discharge.  Take your pain medication as prescribed.  i. If you are having problems/concerns with the prescription medicine (does not control pain, nausea, vomiting, rash, itching, etc), please call us [phone] to see if we need to switch you to a different pain medicine that will work better for you and/or control your side effect better. ii. If you need a refill on your pain medication, please contact your pharmacy.  They will contact our office to request authorization. Prescriptions will not be filled after 5  pm or on week-ends. 4. Avoid getting constipated.  Between the surgery and the pain medications, it is common to experience some constipation.  Increasing fluid intake and taking a fiber supplement (such as Metamucil, Citrucel, FiberCon, MiraLax, etc) 1-2 times a day regularly will usually help prevent this problem from occurring.  A mild laxative (prune juice, Milk of Magnesia, MiraLax, etc) should be taken according to package directions if there are no bowel movements after 48 hours.   5. Watch out for diarrhea.  If you have many loose bowel movements, simplify your diet to bland foods & liquids for a few days.  Stop any stool softeners and decrease your fiber supplement.  Switching to mild anti-diarrheal medications (Kayopectate, Pepto Bismol) can help.  If this worsens or does not improve, please call us. 6. Wash / shower every day.  You may shower over the dressings as they are waterproof.  Continue to shower over incision(s) after the dressing is off. 7. Remove your waterproof bandages 5 days after surgery.  You may leave the incision open to air.  You may replace a dressing/Band-Aid to cover the incision for comfort if you wish.  8.  ACTIVITIES as tolerated:   a. You may resume regular (light) daily activities beginning the next day--such as daily self-care, walking, climbing stairs--gradually increasing activities as tolerated.  If you can walk 30 minutes without difficulty, it is safe to try more intense activity such as jogging, treadmill, bicycling, low-impact aerobics, swimming, etc. b. Save the most intensive and strenuous activity for last such as sit-ups, heavy lifting, contact sports, etc  Refrain from any heavy lifting or straining until you are off narcotics for pain control.   c. DO NOT PUSH THROUGH PAIN.  Let pain be your guide: If it hurts to do something, don't do it.  Pain is your body warning you to avoid that activity for another week until the pain goes down. d. You may drive when  you are no longer taking prescription pain medication, you can comfortably wear a seatbelt, and you can safely maneuver your car and apply brakes. e. Bonita Quin may have sexual intercourse when it is comfortable.  9. FOLLOW UP in our office a. Please call [facility] at [phone] to set up an appointment to see your surgeon in the office for a follow-up appointment approximately 2-3 weeks after your surgery. b. Make sure that you call for this appointment the day you arrive home to insure a convenient appointment time. 10. IF YOU HAVE DISABILITY OR FAMILY LEAVE FORMS, BRING THEM TO THE OFFICE FOR PROCESSING.  DO NOT GIVE THEM TO YOUR DOCTOR.   WHEN TO CALL us [phone]: 1. Poor pain control 2. Reactions / problems with new medications (rash/itching, nausea, etc)  3. Fever over 101.5 F (38.5 C) 4. Inability to urinate 5. Nausea and/or vomiting 6. Worsening swelling or bruising 7. Continued bleeding from incision. 8. Increased pain, redness, or drainage from the incision   The clinic staff is available to answer your questions during regular business hours (8:30am-5pm).  Please dont hesitate to call and ask to speak to one of our nurses for clinical concerns.   If you have a medical emergency, go to the nearest emergency room or call 911.  A surgeon from [facility] is always on call at the [facility], [place] [address], [address], [place], [place]  [address] ? MAIN: [phone] ? TOLL FREE: [phone] ?  FAX [phone] [email or web address]  GETTING TO GOOD BOWEL HEALTH. Irregular bowel habits such as constipation and diarrhea can lead to many problems over time.  Having one soft bowel movement a day is the most important way to prevent further problems.  The anorectal canal is designed to handle stretching and feces to safely manage our ability to get rid of solid waste (feces, poop, stool) out of our body.  BUT, hard  constipated stools can act like ripping concrete bricks and diarrhea can be a burning fire to this very sensitive area of our body, causing inflamed hemorrhoids, anal fissures, increasing risk is perirectal abscesses, abdominal pain/bloating, an making irritable bowel worse.      The goal: ONE SOFT BOWEL MOVEMENT A DAY!  To have soft, regular bowel movements:   Drink plenty of fluids, consider 4-6 tall glasses of water a day.    Take plenty of fiber.  Fiber is the undigested part of plant food that passes into the colon, acting s natures broom to encourage bowel motility and movement.  Fiber can absorb and hold large amounts of water. This results in a larger, bulkier stool, which is soft and easier to  pass. Work gradually over several weeks up to 6 servings a day of fiber (25g a day even more if needed) in the form of: o Vegetables -- Root (potatoes, carrots, turnips), leafy green (lettuce, salad greens, celery, spinach), or cooked high residue (cabbage, broccoli, etc) o Fruit -- Fresh (unpeeled skin & pulp), Dried (prunes, apricots, cherries, etc ),  or stewed ( applesauce)  o Whole grain breads, pasta, etc (whole wheat)  o Bran cereals   Bulking Agents -- This type of water-retaining fiber generally is easily obtained each day by one of the following:  o Psyllium bran -- The psyllium plant is remarkable because its ground seeds can retain so much water. This product is available as Metamucil, Konsyl, Effersyllium, Per Diem Fiber, or the less expensive generic preparation in drug and health food stores. Although labeled a laxative, it really is not a laxative.  o Methylcellulose -- This is another fiber derived from wood which also retains water. It is available as Citrucel. o Polyethylene Glycol - and artificial fiber commonly called Miralax or Glycolax.  It is helpful for people with gassy or bloated feelings with regular fiber o Flax Seed - a less gassy fiber than psyllium  No reading or  other relaxing activity while on the toilet. If bowel movements take longer than 5 minutes, you are too constipated  AVOID CONSTIPATION.  High fiber and water intake usually takes care of this.  Sometimes a laxative is needed to stimulate more frequent bowel movements, but   Laxatives are not a good long-term solution as it can wear the colon out.  They can help jump-start bowels if constipated, but should be relied on constantly without discussing with your doctor o Osmotics (Milk of Magnesia, Fleets phosphosoda, Magnesium citrate, MiraLax, GoLytely) are safer than  o Stimulants (Senokot, Castor Oil, Dulcolax, Ex Lax)    o Avoid taking laxatives for more than 7 days in a row.   IF SEVERELY CONSTIPATED, try a Bowel Retraining Program: o Do not use laxatives.  o Eat a diet high in roughage, such as bran cereals and leafy vegetables.  o Drink six (6) ounces of prune or apricot juice each morning.  o Eat two (2) large servings of stewed fruit each day.  o Take one (1) heaping tablespoon of a psyllium-based bulking agent twice a day. Use sugar-free sweetener when possible to avoid excessive calories.  o Eat a normal breakfast.  o Set aside 15 minutes after breakfast to sit on the toilet, but do not strain to have a bowel movement.  o If you do not have a bowel movement by the third day, use an enema and repeat the above steps.   Controlling diarrhea o Switch to liquids and simpler foods for a few days to avoid stressing your intestines further. o Avoid dairy products (especially milk & ice cream) for a short time.  The intestines often can lose the ability to digest lactose when stressed. o Avoid foods that cause gassiness or bloating.  Typical foods include beans and other legumes, cabbage, broccoli, and dairy foods.  Every person has some sensitivity to other foods, so listen to our body and avoid those foods that trigger problems for you. o Adding fiber (Citrucel, Metamucil, psyllium, Miralax)  gradually can help thicken stools by absorbing excess fluid and retrain the intestines to act more normally.  Slowly increase the dose over a few weeks.  Too much fiber too soon can backfire and cause cramping & bloating. o Probiotics (  such as active yogurt, Align, etc) may help repopulate the intestines and colon with normal bacteria and calm down a sensitive digestive tract.  Most studies show it to be of mild help, though, and such products can be costly. o Medicines: - Bismuth subsalicylate (ex. Kayopectate, Pepto Bismol) every 30 minutes for up to 6 doses can help control diarrhea.  Avoid if pregnant. - Loperamide (Immodium) can slow down diarrhea.  Start with two tablets (  total) first and then try one tablet every 6 hours.  Avoid if you are having fevers or severe pain.  If you are not better or start feeling worse, stop all medicines and call your doctor for advice o Call your doctor if you are getting worse or not better.  Sometimes further testing (cultures, endoscopy, X-ray studies, bloodwork, etc) may be needed to help diagnose and treat the cause of the diarrhea.  TROUBLESHOOTING IRREGULAR BOWELS 1) Avoid extremes of bowel movements (no bad constipation/diarrhea) 2) Miralax 17gm mixed in 8oz. water or juice-daily. May use BID as needed.  3) Gas-x,Phazyme, etc. as needed for gas & bloating.  4) Soft,bland diet. No spicy,greasy,fried foods.  5) Prilosec over-the-counter as needed  6) May hold gluten/wheat products from diet to see if symptoms improve.  7)  May try probiotics (Align, Activa, etc) to help calm the bowels down 7) If symptoms become worse call back immediately.  Managing Pain  Pain after surgery or related to activity is often due to strain/injury to muscle, tendon, nerves and/or incisions.  This pain is usually short-term and will improve in a few months.   Many people find it helpful to do the following things TOGETHER to help speed the process of healing and to get  back to regular activity more quickly:  1. Avoid heavy physical activity at first a. No lifting greater than 20 pounds at first, then increase to lifting as tolerated over the next few weeks b. Do not push through the pain.  Listen to your body and avoid positions and maneuvers than reproduce the pain.  Wait a few days before trying something more intense c. Walking is okay as tolerated, but go slowly and stop when getting sore.  If you can walk 30 minutes without stopping or pain, you can try more intense activity (running, jogging, aerobics, cycling, swimming, treadmill, sex, sports, weightlifting, etc ) d. Remember: If it hurts to do it, then dont do it!  2. Take Acetaminophen Anti-inflammatory medication i. Acetaminophen  tabs (Tylenol) 1-2 pills with every meal and just before bedtime (avoid if you have liver problems) ii. Take with food/snack around the clock for 1-2 weeks iii. This helps the muscle and nerve tissues become less irritable and calm down faster  3. Use a Heating pad or Ice/Cold Pack a. 4-6 times a day b. May use warm bath/hottub  or showers  4. Try Gentle Massage and/or Stretching  a. at the area of pain many times a day b. stop if you feel pain - do not overdo it  Try these steps together to help you body heal faster and avoid making things get worse.  Doing just one of these things may not be enough.    If you are not getting better after two weeks or are noticing you are getting worse, contact our office for further advice; we may need to re-evaluate you & see what other things we can do to help.

## 2015-08-18 NOTE — Progress Notes (Addendum)
ANTIBIOTIC CONSULT NOTE - Follow-Up  Pharmacy Consult for Zosyn Indication: perforated duodenal ulcer  Allergies  Allergen Reactions  . Nsaids Other (See Comments)    H/o perforated ulcer 08/15/2015    Patient Measurements: Height: 6' (182.9 cm) (Estimate) Weight: 194 lb 10.7 oz (88.3 kg) IBW/kg (Calculated) : 77.6  Vital Signs: Temp: 98 F (36.7 C) (10/14 1400) Temp Source: Oral (10/14 1400) BP: 105/54 mmHg (10/14 1400) Pulse Rate: 69 (10/14 1400) Intake/Output from previous day: 10/13 0701 - 10/14 0700 In: 2750 [P.O.:420; I.V.:1580; IV Piggyback:750] Out: 2475 [Urine:2475] Intake/Output from this shift: Total I/O In: 720 [P.O.:720] Out: 1500 [Urine:1500]  Labs:  Recent Labs  08/16/15 0542 08/18/15 0545  WBC 20.7* 10.6*  HGB 11.7* 10.8*  PLT 346 306  CREATININE 0.70 0.67   Estimated Creatinine Clearance: 152.2 mL/min (by C-G formula based on Cr of 0.67). No results for input(s): VANCOTROUGH, VANCOPEAK, VANCORANDOM, GENTTROUGH, GENTPEAK, GENTRANDOM, TOBRATROUGH, TOBRAPEAK, TOBRARND, AMIKACINPEAK, AMIKACINTROU, AMIKACIN in the last 72 hours.   Microbiology: No results found for this or any previous visit (from the past 720 hour(s)).  Medical History: Past Medical History  Diagnosis Date  . Gastritis     [place]  . Tobacco abuse 08/15/2015  . Constipation, chronic 08/15/2015    Assessment: 28 y/oM with PMH of gastritis, tobacco abuse, chronic constipation who presented with worsening abdominal pain, n/v. CT scan concerning for free intra-abdominal air and free fluid in the abdomen and pelvis, fluid present around pyloric region of stomach and duodenal bulb suggesting perforated ulcer. Patient underwent exploratory laparotomy with duodenal repair today. Pharmacy consulted to assist with dosing of Zosyn. H. Pylori antibody, IgG positive-Clarithromycin added on 10/14 (also on PPI BID).  10/11 >> Vancomycin x 1 10/11 >> Zosyn >>   10/14 >>  Clarithromycin >>  Tmax: 99.72F WBC elevated, but trending down SCr 0.67 with CrCl > 100 ml/min    Goal of Therapy:  Appropriate antibiotic dosing for renal function and indication Eradication of infection  Plan:  Confirmed with [facility] to continue Zosyn for now due to perforated duodenal ulcer. Plans to change to PO antibiotic (for triple therapy for H.pylori with Clarithromycin + PPI at discharge). Current dosing of Zosyn 3.375g IV q8h (infuse over 4 hours) remains appropriate for renal function/indication. Continue to monitor clinical course.   Greer PickerelJigna Husna Krone, PharmD, BCPS Pager: [phone] 08/18/2015 3:38 PM

## 2015-08-18 NOTE — Progress Notes (Signed)
3 Days Post-Op  Subjective: He looks great and is ready for food.   Objective: Vital signs in last 24 hours: Temp:  [98.6 F (37 C)-99.5 F (37.5 C)] 98.6 F (37 C) (10/14 0536) Pulse Rate:  [67-87] 67 (10/14 0536) Resp:  [17-18] 17 (10/14 0536) BP: (97-106)/(50-53) 97/50 mmHg (10/14 0536) SpO2:  [98 %-100 %] 98 % (10/14 0536) Last BM Date: 08/14/15 420 PO  Diet: clears Good urine Afebrile, VSS WBC much improved UGI yesterday shows no leak.   Intake/Output from previous day: 10/13 0701 - 10/14 0700 In: 2750 [P.O.:420; I.V.:1580; IV Piggyback:750] Out: 2475 [Urine:2475] Intake/Output this shift:    General appearance: alert, cooperative and no distress Resp: clear to auscultation bilaterally GI: soft sore, sites all look great  Lab Results:   Recent Labs  08/16/15 0542 08/18/15 0545  WBC 20.7* 10.6*  HGB 11.7* 10.8*  HCT 33.1* 30.8*  PLT 346 306    BMET  Recent Labs  08/16/15 0542 08/18/15 0545  NA 136 136  K 3.9 3.7  CL 101 105  CO2 27 26  GLUCOSE 115* 88  BUN 9 7  CREATININE 0.70 0.67  CALCIUM 8.8* 8.4*   PT/INR No results for input(s): LABPROT, INR in the last 72 hours.   Recent Labs Lab 08/15/15 0134  AST 24  ALT 16*  ALKPHOS 70  BILITOT 0.8  PROT 7.3  ALBUMIN 4.1     Lipase     Component Value Date/Time   LIPASE 24 08/15/2015 0134     Studies/Results: Dg Ugi W/water Sol Cm  08/17/2015  CLINICAL DATA:  Two days postop from surgical repair of perforated duodenal ulcer. EXAM: WATER SOLUBLE UPPER GI SERIES TECHNIQUE: Single-column upper GI series was performed using water soluble contrast. CONTRAST:  450mL OMNIPAQUE IOHEXOL 300 MG/ML  SOLN COMPARISON:  None. FLUOROSCOPY TIME:  Fluoroscopy Time (in minutes and seconds): 2 minutes 40 seconds Number of Acquired Images:  15 FINDINGS: The scout radiograph shows a normal bowel gas pattern. No evidence esophageal mass or obstruction. No evidence hiatal hernia. Wall thickening is seen  involving the distal gastric body and antrum. Ulcer with surrounding mound of edema is seen in the gastric antrum. There is no evidence of contrast leak or extravasation. Mildly delayed gastric emptying is noted, however the duodenum is nondilated and normal in appearance. IMPRESSION: Gastric ulcer with surrounding edema in the distal antrum. No evidence of contrast leak or extravasation. Consider endoscopy for further evaluation. Mildly delayed gastric emptying. No evidence of gastric outlet obstruction Electronically Signed   By: Myles RosenthalJohn  Stahl M.D.   On: 08/17/2015 13:28    Medications: . antiseptic oral rinse  7 mL Mouth Rinse BID  . enoxaparin (LOVENOX) injection  40 mg Subcutaneous Q24H  . lip balm  1 application Topical BID  . pantoprazole (PROTONIX) IV  80 mg Intravenous Q12H  . piperacillin-tazobactam (ZOSYN)  IV  3.375 g Intravenous 3 times per day    Assessment/Plan Perforated duodenal ulcer S/p exploratory laparotomy with possible duodenal repair, 08/15/15, Dr. Star AgeStephen Gross Antibiotics: Day 4 Zosyn; day 1/14 clarithromycin DVT: Lovenox/SCD   Plan;  Decrease IV, full liquids, start changing drugs to PO, start clarithromycin.  He should be able to go home soon.    LOS: 3 days    Jaquelin Meaney 08/18/2015

## 2015-08-19 MED ORDER — PANTOPRAZOLE SODIUM 40 MG PO TBEC: 40.0000 mg | DELAYED_RELEASE_TABLET | Freq: Two times a day (BID) | ORAL | Status: DC

## 2015-08-19 MED ORDER — OXYCODONE-ACETAMINOPHEN 5-325 MG PO TABS: 1.0000 | ORAL_TABLET | ORAL | Status: DC | PRN

## 2015-08-19 MED ORDER — AMOXICILLIN 500 MG PO TABS: 1000.0000 mg | ORAL_TABLET | Freq: Two times a day (BID) | ORAL | Status: DC

## 2015-08-19 MED ORDER — CLARITHROMYCIN 500 MG PO TABS: 500.0000 mg | ORAL_TABLET | Freq: Two times a day (BID) | ORAL | Status: DC

## 2015-08-19 NOTE — Discharge Summary (Signed)
Physician Discharge Summary [facility]  Patient ID: Ricardo Mckinney MRN: [id number] DOB/AGE: 1986/09/07 29 y.o.  Admit date: 08/15/2015 Discharge date: 08/19/2015  Admission Diagnoses:  Perforated pyloric channel ulcer  Discharge Diagnoses:  Principal Problem:   Perforated duodenal ulcer  Active Problems:   Spanish speaking patient   Tobacco abuse   Constipation, chronic   Perforated duodenal bulb ulcer (HCC)   Discharged Condition: good  Hospital Course: patient admitted from ER and taken emergently to OR for laparoscopic Graham patch closure of perforated pyloric channel ulcer.  Post op course uneventful.  H. Pylori test positive.  Patient prepared for discharge on POD#5.  Will treat with triple therapy for 2 weeks (Clarithromycin, amoxicillin, and protonix)  Consults: None  Treatments: surgery: lap Graham patch closure of perforated pyloric channel ulcer  Discharge Exam: Blood pressure 90/48, pulse 58, temperature 98.1 F (36.7 C), temperature source Oral, resp. rate 18, height 6' (1.829 m), weight 88.3 kg (194 lb 10.7 oz), SpO2 100 %. HEENT - clear Neck - soft Chest - clear bilaterally Cor - RRR Abd - soft without distension; wounds dry and intact; non-tender  Disposition: Home  Discharge Instructions    Diet - low sodium heart healthy    Complete by:  As directed      Discharge instructions    Complete by:  As directed   [facility]  LAPAROSCOPIC SURGERY:  POST-OP INSTRUCTIONS  Always review your discharge instruction sheet given to you by the facility where your surgery was performed.  A prescription for pain medication may be given to you upon discharge.  Take your pain medication as prescribed.  If narcotic pain medicine is not needed, then you may take acetaminophen (Tylenol) or ibuprofen (Advil) as needed.  Take your usually prescribed medications unless otherwise directed.  If you need a refill on your pain  medication, please contact your pharmacy.  They will contact our office to request authorization. Prescriptions will not be filled after 5 P.M. or on weekends.  You should follow a light diet the first few days after arrival home, such as soup and crackers or toast.  Be sure to include plenty of fluids daily.  Most patients will experience some swelling and bruising in the area of the incisions.  Ice packs will help.  Swelling and bruising can take several days to resolve.   It is common to experience some constipation after surgery.  Increasing fluid intake and taking a stool softener (such as Colace) will usually help or prevent this problem from occurring.  A mild laxative (Milk of Magnesia or Miralax) should be taken according to package instructions if there has been no bowel movement after 48 hours.  You will have steri-strips and a gauze dressing over your incisions.  You may remove the gauze bandage on the second day after surgery, and you may shower at that time.  Leave your steri-strips (small skin tapes) in place directly over the incision.  These strips should remain on the skin for 5-7 days and then be removed.  You may get them wet in the shower and pat them dry.  Any sutures or staples will be removed at the office during your follow-up visit.  ACTIVITIES:  You may resume regular (light) daily activities beginning the next day - such as daily self-care, walking, climbing stairs - gradually increasing activities as tolerated.  You may have sexual intercourse when it is comfortable.  Refrain from any heavy lifting or straining  until approved by your doctor.  You may drive when you are no longer taking prescription pain medication, you can comfortably wear a seatbelt, and you can safely maneuver your car and apply brakes.  You should see your doctor in the office for a follow-up appointment approximately 2-3 weeks after your surgery.  Make sure that you call for this appointment within a  day or two after you arrive home to insure a convenient appointment time.  WHEN TO CALL YOUR DOCTOR: Fever over 101.0 Inability to urinate Continued bleeding from incision Increased pain, redness, or drainage from the incision Increasing abdominal pain  The clinic staff is available to answer your questions during regular business hours.  Please don't hesitate to call and ask to speak to one of the nurses for clinical concerns.  If you have a medical emergency, go to the nearest emergency room or call 911.  A surgeon from [facility] is always on call for the hospital.  Velora Heckler, MD, [facility] Office: [phone] Toll Free:  [phone] FAX [phone]  Website: [email or web address]     Increase activity slowly    Complete by:  As directed      No dressing needed    Complete by:  As directed             Medication List    TAKE these medications        amoxicillin 500 MG tablet  Commonly known as:  AMOXIL  Take 2 tablets (1,000 mg total) by mouth 2 (two) times daily.     clarithromycin 500 MG tablet  Commonly known as:  BIAXIN  Take 1 tablet (500 mg total) by mouth 2 (two) times daily.     omeprazole 20 MG capsule  Commonly known as:  PRILOSEC  1 bid x 15 d     ondansetron 4 MG tablet  Commonly known as:  ZOFRAN  Take 1 tablet (4 mg total) by mouth every 6 (six) hours. As needed for nausea and vomiting     oxyCODONE-acetaminophen 5-325 MG tablet  Commonly known as:  PERCOCET/ROXICET  Take 1-2 tablets by mouth every 4 (four) hours as needed for moderate pain.     pantoprazole 40 MG tablet  Commonly known as:  PROTONIX  Take 1 tablet (40 mg total) by mouth 2 (two) times daily.     sucralfate 1 GM/10ML suspension  Commonly known as:  CARAFATE  Take 10 mLs (1 g total) by mouth 4 (four) times daily -  with meals and at bedtime.           Follow-up Information    Follow up with GROSS,STEVEN C., MD.  Schedule an appointment as soon as possible for a visit in 2 weeks.   Specialty:  General Surgery   Why:  To follow up after your operation, To follow up after your hospital stay   Contact information:   [address] [place] [address] [phone]       Velora Heckler, MD, [facility] Office: [phone]   Signed: Velora Heckler 08/19/2015, 11:11 AM

## 2015-08-19 NOTE — Discharge Summary (Signed)
Reviewed d/c instructions with pt and his sister using Spanish interpreter.  Answered all questions and pt/sister verbalizing good understanding of all topics discussed.  Pt being d/c to home into care of family.

## 2015-09-07 ENCOUNTER — Ambulatory Visit: Payer: Self-pay | Admitting: Internal Medicine

## 2020-04-29 ENCOUNTER — Other Ambulatory Visit: Payer: Self-pay

## 2020-04-29 ENCOUNTER — Emergency Department (HOSPITAL_COMMUNITY): Payer: Self-pay

## 2020-04-29 ENCOUNTER — Encounter (HOSPITAL_COMMUNITY): Payer: Self-pay | Admitting: Emergency Medicine

## 2020-04-29 ENCOUNTER — Emergency Department (HOSPITAL_COMMUNITY)
Admission: EM | Admit: 2020-04-29 | Discharge: 2020-04-29 | Disposition: A | Discharge status: Home / Self Care | Payer: Self-pay | Attending: Emergency Medicine | Admitting: Emergency Medicine

## 2020-04-29 DIAGNOSIS — Z79899 Other long term (current) drug therapy: Secondary | ICD-10-CM | POA: Insufficient documentation

## 2020-04-29 DIAGNOSIS — F17210 Nicotine dependence, cigarettes, uncomplicated: Secondary | ICD-10-CM | POA: Insufficient documentation

## 2020-04-29 DIAGNOSIS — K2900 Acute gastritis without bleeding: Secondary | ICD-10-CM | POA: Insufficient documentation

## 2020-04-29 LAB — CBC
HCT: 40.6 % (ref 39.0–52.0)
Hemoglobin: 13.8 g/dL (ref 13.0–17.0)
MCH: 30.3 pg (ref 26.0–34.0)
MCHC: 34 g/dL (ref 30.0–36.0)
MCV: 89 fL (ref 80.0–100.0)
Platelets: 361 10*3/uL (ref 150–400)
RBC: 4.56 MIL/uL (ref 4.22–5.81)
RDW: 13.2 % (ref 11.5–15.5)
WBC: 13.9 10*3/uL — ABNORMAL HIGH (ref 4.0–10.5)
nRBC: 0 % (ref 0.0–0.2)

## 2020-04-29 LAB — COMPREHENSIVE METABOLIC PANEL
ALT: 33 U/L (ref 0–44)
AST: 70 U/L — ABNORMAL HIGH (ref 15–41)
Albumin: 3.8 g/dL (ref 3.5–5.0)
Alkaline Phosphatase: 98 U/L (ref 38–126)
Anion gap: 8 (ref 5–15)
BUN: 11 mg/dL (ref 6–20)
CO2: 27 mmol/L (ref 22–32)
Calcium: 9.4 mg/dL (ref 8.9–10.3)
Chloride: 105 mmol/L (ref 98–111)
Creatinine, Ser: 1.07 mg/dL (ref 0.61–1.24)
GFR calc Af Amer: >60 mL/min (ref >60)
GFR calc non Af Amer: >60 mL/min (ref >60)
Glucose, Bld: 118 mg/dL — ABNORMAL HIGH (ref 70–99)
Potassium: 3.8 mmol/L (ref 3.5–5.1)
Sodium: 140 mmol/L (ref 135–145)
Total Bilirubin: 1 mg/dL (ref 0.3–1.2)
Total Protein: 7.1 g/dL (ref 6.5–8.1)

## 2020-04-29 LAB — LIPASE, BLOOD: Lipase: 25 U/L (ref 11–51)

## 2020-04-29 MED ORDER — ONDANSETRON HCL 4 MG/2ML IJ SOLN
4.0000 mg | Freq: Once | INTRAMUSCULAR | Status: AC
  Administered 2020-04-29: 4 mg via INTRAVENOUS
  Filled 2020-04-29: qty 2

## 2020-04-29 MED ORDER — SODIUM CHLORIDE 0.9 % IV BOLUS
1000.0000 mL | Freq: Once | INTRAVENOUS | Status: AC
  Administered 2020-04-29: 1000 mL via INTRAVENOUS

## 2020-04-29 MED ORDER — SODIUM CHLORIDE 0.9% FLUSH
3.0000 mL | Freq: Once | INTRAVENOUS | Status: AC
  Administered 2020-04-29: 3 mL via INTRAVENOUS

## 2020-04-29 MED ORDER — PANTOPRAZOLE SODIUM 40 MG IV SOLR
40.0000 mg | Freq: Once | INTRAVENOUS | Status: AC
  Administered 2020-04-29: 40 mg via INTRAVENOUS
  Filled 2020-04-29: qty 40

## 2020-04-29 MED ORDER — OMEPRAZOLE 40 MG PO CPDR: 40.0000 mg | DELAYED_RELEASE_CAPSULE | Freq: Two times a day (BID) | ORAL | 1 refills | Status: DC

## 2020-04-29 MED ORDER — ALUM & MAG HYDROXIDE-SIMETH 200-200-20 MG/5ML PO SUSP
30.0000 mL | Freq: Once | ORAL | Status: AC
  Administered 2020-04-29: 30 mL via ORAL
  Filled 2020-04-29: qty 30

## 2020-04-29 MED ORDER — SUCRALFATE 1 G PO TABS: 1.0000 g | ORAL_TABLET | Freq: Three times a day (TID) | ORAL | 1 refills | Status: DC | PRN

## 2020-04-29 NOTE — Discharge Instructions (Addendum)
Tome su omeprazol 40 mg [employer] al [employer] Deber realizar un seguimiento con gastroenterologa o, al menos, establecerse con un proveedor de atencin primaria para la evaluacin y el tratamiento continuos de su gastritis conocida.  Tambin le he recetado Carafate, que me gustara que tomara, segn sea necesario, para los sntomas de la acidez estomacal.  Regrese al servicio de urgencias o busque atencin mdica inmediata si experimenta algn sntoma nuevo o que empeora. Regrese si tiene debilidad, heces negras, vmitos con sangre, empeoramiento del dolor abdominal u otros sntomas.   Please take your omeprazole 40 mg twice daily every day.  You will need to follow-up with gastroenterology or at the very least get established with a primary care provider for ongoing evaluation and management of your known gastritis.  I have also prescribed you Carafate which I would like you to take, as needed, for symptoms of heartburn.  Please return to the ED or seek immediate medical attention should you experience any new or worsening symptoms.  Please return for any weakness, black stools, bloody vomiting, worsening abdominal pain, or other symptoms.

## 2020-04-29 NOTE — ED Provider Notes (Signed)
[facility] EMERGENCY DEPARTMENT Provider Note   CSN: [id number] Arrival date & time: 04/29/20  1324     History Chief Complaint  Patient presents with   Abdominal Pain    Ricardo Mckinney is a 34 y.o. male with PMH of perforated gastric ulcer and gastritis who presents to the ED with complaints of epigastric abdominal pain with vomiting that began this morning.  Patient endorses tobacco and alcohol use, but denies any NSAIDs or aspirins.  He does not take any antacids regularly.  He states that he has had a perforated ulcer in the past and that this feels similar.  He states that he had just eaten a large bowl of soup that was full of jalapenos and that he developed 10 out of 10 pain shortly thereafter.  He has also had episodes of emesis, however nonbloody.  No coffee-ground emesis.  He denies any fevers or chills, no recent infection.  On exam, he tells me that the pain comes and goes and that he is currently not in any significant discomfort.  However, when he does, he feels as though he may pass out.  He denies any changes in bowel habits, recent melena or hematochezia, or other symptoms.  He does not see a primary care provider or gastroenterologist.    HPI     Past Medical History:  Diagnosis Date   Constipation, chronic 08/15/2015   Gastritis    Dx in [place] ~2010   Perforated gastric ulcer (HCC) 08/2015   Tobacco abuse 08/15/2015    Patient Active Problem List   Diagnosis Date Noted   Perforated duodenal ulcer  08/15/2015   Spanish speaking patient 08/15/2015   Tobacco abuse 08/15/2015   Constipation, chronic 08/15/2015   Perforated duodenal bulb ulcer (HCC) 08/15/2015    Past Surgical History:  Procedure Laterality Date   LAPAROTOMY N/A 08/15/2015   Procedure: EXPLORATORY LAPAROTOMY duodenal perforation repair;  Surgeon: Karie Soda, MD;  Location: [facility];  Service: General;  Laterality: N/A;       Family History  Problem Relation  Age of Onset   Alcohol abuse Father     Social History   Tobacco Use   Smoking status: Current Every Day Smoker    Types: Cigarettes  Substance Use Topics   Alcohol use: Yes    Alcohol/week: 0.0 standard drinks    Comment: quit   Drug use: Yes    Home Medications Prior to Admission medications   Medication Sig Start Date End Date Taking? Authorizing Provider  amoxicillin (AMOXIL) 500 MG tablet Take 2 tablets (1,000 mg total) by mouth 2 (two) times daily. 08/19/15   Darnell Level, MD  clarithromycin (BIAXIN) 500 MG tablet Take 1 tablet (500 mg total) by mouth 2 (two) times daily. 08/19/15   Darnell Level, MD  omeprazole (PRILOSEC) 40 MG capsule Take 1 capsule (40 mg total) by mouth 2 (two) times daily before a meal. 04/29/20 05/29/20  Chilton Si, Sharion Settler, PA-C  ondansetron (ZOFRAN) 4 MG tablet Take 1 tablet (4 mg total) by mouth every 6 (six) hours. As needed for nausea and vomiting Patient not taking: Reported on 08/15/2015 03/26/14   Hayden Rasmussen, NP  oxyCODONE-acetaminophen (PERCOCET/ROXICET) 5-325 MG tablet Take 1-2 tablets by mouth every 4 (four) hours as needed for moderate pain. 08/19/15   Darnell Level, MD  pantoprazole (PROTONIX) 40 MG tablet Take 1 tablet (40 mg total) by mouth 2 (two) times daily. 08/19/15   Darnell Level, MD  sucralfate (CARAFATE) 1  g tablet Take 1 tablet (1 g total) by mouth 3 (three) times daily as needed (Heartburn). 04/29/20   Lorelee New, PA-C    Allergies    Nsaids  Review of Systems   Review of Systems  All other systems reviewed and are negative.   Physical Exam Updated Vital Signs BP 127/79 (BP Location: Right Arm)    Pulse 64    Temp 97.9 F (36.6 C) (Oral)    Resp 18    SpO2 100%   Physical Exam Vitals and nursing note reviewed. Exam conducted with a chaperone present.  HENT:     Head: Normocephalic and atraumatic.     Mouth/Throat:     Comments: No bleeding visualized in the oropharynx. Eyes:     General: No scleral icterus.     Conjunctiva/sclera: Conjunctivae normal.  Cardiovascular:     Rate and Rhythm: Normal rate and regular rhythm.     Pulses: Normal pulses.     Comments: Symmetric pulses bilaterally. Pulmonary:     Effort: Pulmonary effort is normal. No respiratory distress.     Breath sounds: Normal breath sounds. No wheezing.     Comments: No increased respiratory effort. Abdominal:     Comments: TTP in RUQ and epigastrium.  No TTP elsewhere.  No overlying skin changes.  NABS.  Musculoskeletal:     Cervical back: Normal range of motion.     Right lower leg: No edema.     Left lower leg: No edema.  Skin:    General: Skin is dry.     Capillary Refill: Capillary refill takes less than 2 seconds.  Neurological:     Mental Status: He is alert and oriented to person, place, and time.     GCS: GCS eye subscore is 4. GCS verbal subscore is 5. GCS motor subscore is 6.  Psychiatric:        Mood and Affect: Mood normal.        Behavior: Behavior normal.        Thought Content: Thought content normal.     ED Results / Procedures / Treatments   Labs (all labs ordered are listed, but only abnormal results are displayed) Labs Reviewed  COMPREHENSIVE METABOLIC PANEL - Abnormal; Notable for the following components:      Result Value   Glucose, Bld 118 (*)    AST 70 (*)    All other components within normal limits  CBC - Abnormal; Notable for the following components:   WBC 13.9 (*)    All other components within normal limits  LIPASE, BLOOD  URINALYSIS, ROUTINE W REFLEX MICROSCOPIC    EKG None  Radiology US Abdomen Limited  Result Date: 04/29/2020 CLINICAL DATA:  Right upper quadrant and epigastric pain, emesis EXAM: ULTRASOUND ABDOMEN LIMITED RIGHT UPPER QUADRANT COMPARISON:  None. FINDINGS: Gallbladder: Gallbladder contains multiple echogenic, posteriorly shadowing gallstones measuring up to 9 mm in maximal diameter. No pericholecystic fluid or frank gallbladder wall thickening is evident.  Sonographic Eulah Pont sign is reportedly negative. Common bile duct: Diameter: 4 mm, nondilated Liver: No focal lesion identified. Within normal limits in parenchymal echogenicity. Portal vein is patent on color Doppler imaging with normal direction of blood flow towards the liver. Other: None. IMPRESSION: Cholelithiasis without convincing sonographic evidence of cholecystitis. Electronically Signed   By: Kreg Shropshire M.D.   On: 04/29/2020 18:58    Procedures Procedures (including critical care time)  Medications Ordered in ED Medications  sodium chloride flush (NS) 0.9 % injection  3 mL (3 mLs Intravenous Given 04/29/20 1906)  sodium chloride 0.9 % bolus 1,000 mL (1,000 mLs Intravenous Bolus from Bag 04/29/20 1906)  ondansetron (ZOFRAN) injection 4 mg (4 mg Intravenous Given 04/29/20 1905)  pantoprazole (PROTONIX) injection 40 mg (40 mg Intravenous Given 04/29/20 1905)  alum & mag hydroxide-simeth (MAALOX/MYLANTA) 200-200-20 MG/5ML suspension 30 mL (30 mLs Oral Given 04/29/20 1905)    ED Course  I have reviewed the triage vital signs and the nursing notes.  Pertinent labs & imaging results that were available during my care of the patient were reviewed by me and considered in my medical decision making (see chart for details).    MDM Rules/Calculators/A&P                          Patient's states that this feels consistent with his history of perforated gastric ulcer.  However, he states that he is experienced hematemesis during his first episode.  He does endorse eating a spicy diet as well as tobacco and alcohol use.  No NSAID/aspirin use.  Given his RUQ abdominal tenderness on exam in addition to vomiting/pain subsequent to eating a meal, will obtain RUQ ultrasound to assess for gallbladder etiology.  Patient does have a mild leukocytosis, however consistent with patient's baseline.  No other laboratory derangement.  Patient needs to be on 40 mg omeprazole twice daily indefinitely or at least  until he is evaluated by a gastroenterologist for repeat endoscopy given his history of gastritis and perforated peptic ulcers.  Patient declines fecal occult study and is adamant that he has not had any BRBPR or melena.  CBC without evidence of anemia.  Ultrasound demonstrates cholelithiasis without evidence of cholecystitis.  No gallbladder wall thickening pericholecystic fluid.  Negative sonographic Murphy sign.  On subsequent evaluation, patient is feeling entirely improved after Protonix and Maalox.  We will continue to provide IV fluids and reassess.    Patient has no persistent emesis, difficulty swallowing, peritoneal signs, abdominal mass, melena or hematochezia, or hematemesis.  Lower suspicion for perforated ulcer at this time.  VSS and WNL.  Pain well-controlled during my examinations.  He states it is entirely resolved after Maalox and Protonix IV.  Patient is feeling prepared for discharge.  Feel as though it is reasonable for patient to be followed up with gastroenterology on an outpatient basis.  Provide him a referral and encouraged him to schedule appointment for an upper endoscopy for further evaluation of his gastritis, particular if it fails to improve with omeprazole and Carafate.  He had not been taking any medications.  Strict ED return precautions discussed.  All of the evaluation and work-up results were discussed with the patient and any family at bedside. They were provided opportunity to ask any additional questions and have none at this time. They have expressed understanding of verbal discharge instructions as well as return precautions and are agreeable to the plan.   Entire evaluation, assessment, and plan was done using interpretive services.  Final Clinical Impression(s) / ED Diagnoses Final diagnoses:  Acute gastritis, presence of bleeding unspecified, unspecified gastritis type    Rx / DC Orders ED Discharge Orders         Ordered    omeprazole (PRILOSEC) 40 MG  capsule  2 times daily before meals     Discontinue  Reprint     04/29/20 1951    sucralfate (CARAFATE) 1 g tablet  3 times daily PRN  Discontinue  Reprint     04/29/20 1951           Lorelee New, PA-C 04/29/20 Lamonte Richer, MD 04/29/20 2008

## 2020-04-29 NOTE — ED Triage Notes (Signed)
C/o upper abd pain with vomiting since this morning.  Denies diarrhea.

## 2020-05-02 ENCOUNTER — Emergency Department (HOSPITAL_COMMUNITY)
Admission: EM | Admit: 2020-05-02 | Discharge: 2020-05-02 | Disposition: A | Discharge status: Home / Self Care | Payer: No Typology Code available for payment source | Attending: Emergency Medicine | Admitting: Emergency Medicine

## 2020-05-02 ENCOUNTER — Encounter (HOSPITAL_COMMUNITY): Payer: Self-pay

## 2020-05-02 ENCOUNTER — Emergency Department (HOSPITAL_COMMUNITY): Payer: No Typology Code available for payment source

## 2020-05-02 ENCOUNTER — Other Ambulatory Visit: Payer: Self-pay

## 2020-05-02 DIAGNOSIS — F17210 Nicotine dependence, cigarettes, uncomplicated: Secondary | ICD-10-CM | POA: Diagnosis not present

## 2020-05-02 DIAGNOSIS — Y998 Other external cause status: Secondary | ICD-10-CM | POA: Diagnosis not present

## 2020-05-02 DIAGNOSIS — Y92410 Unspecified street and highway as the place of occurrence of the external cause: Secondary | ICD-10-CM | POA: Insufficient documentation

## 2020-05-02 DIAGNOSIS — M542 Cervicalgia: Secondary | ICD-10-CM | POA: Diagnosis not present

## 2020-05-02 DIAGNOSIS — Y9389 Activity, other specified: Secondary | ICD-10-CM | POA: Insufficient documentation

## 2020-05-02 MED ORDER — ACETAMINOPHEN 325 MG PO TABS
650.0000 mg | ORAL_TABLET | Freq: Once | ORAL | Status: AC
  Administered 2020-05-02: 650 mg via ORAL
  Filled 2020-05-02: qty 2

## 2020-05-02 NOTE — ED Notes (Signed)
Patient verbalizes understanding of discharge instructions. Opportunity for questioning and answers were provided. Armband removed by staff, pt discharged from ED stable & ambulatory  

## 2020-05-02 NOTE — Discharge Instructions (Signed)
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
## 2020-07-23 ENCOUNTER — Emergency Department (HOSPITAL_COMMUNITY)
Admission: EM | Admit: 2020-07-23 | Discharge: 2020-07-23 | Discharge status: Against Medical Advice | Payer: No Typology Code available for payment source

## 2021-12-02 IMAGING — US US ABDOMEN LIMITED
1 series · 14 of 25 positions shown · non-contrast
Comparison: None.

CLINICAL DATA: Right upper quadrant and epigastric pain, emesis

EXAM:
ULTRASOUND ABDOMEN LIMITED RIGHT UPPER QUADRANT

[Series 1: us abdomen limited · 14 of 27 slices shown]
[im 1/27]
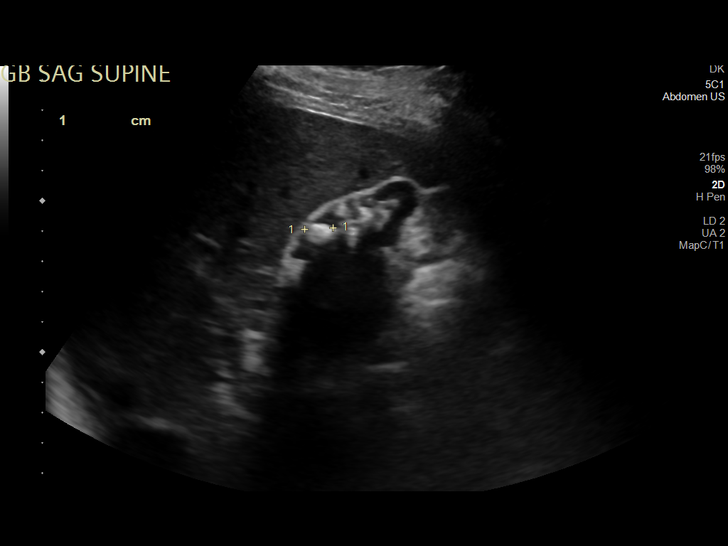
[im 3/27]
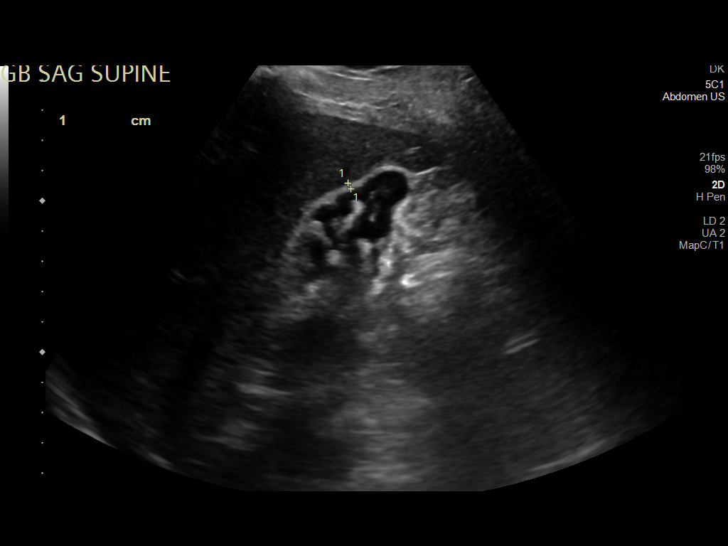
[im 5/27]
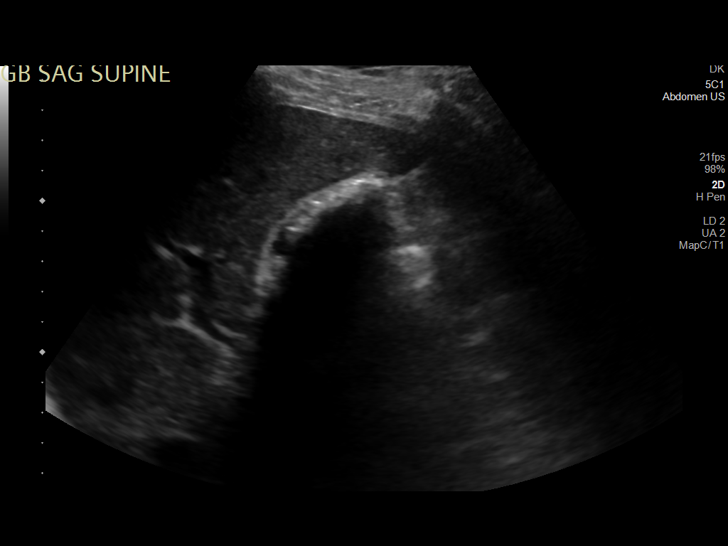
[im 7/27]
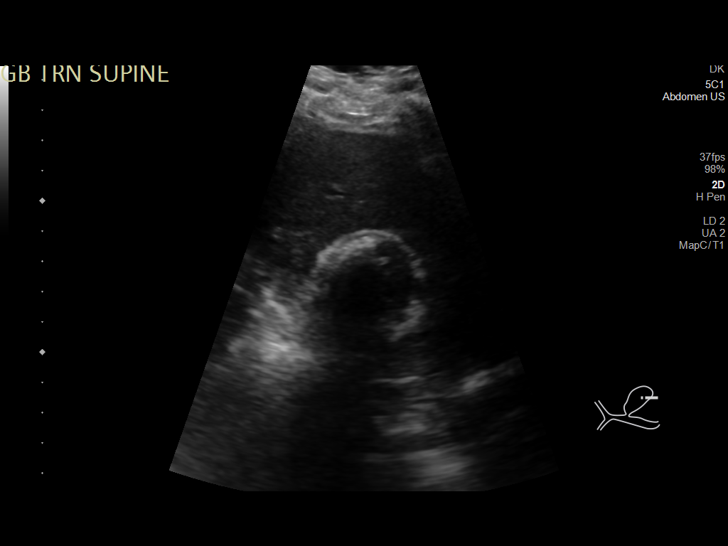
[im 9/27]
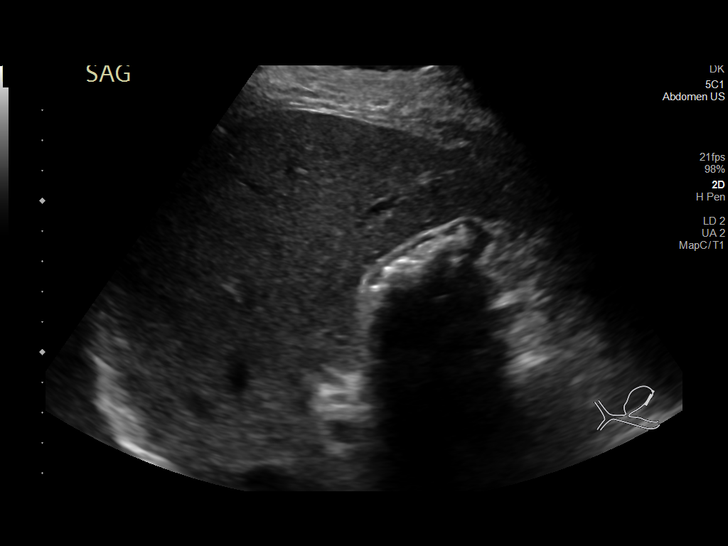
[im 10/27]
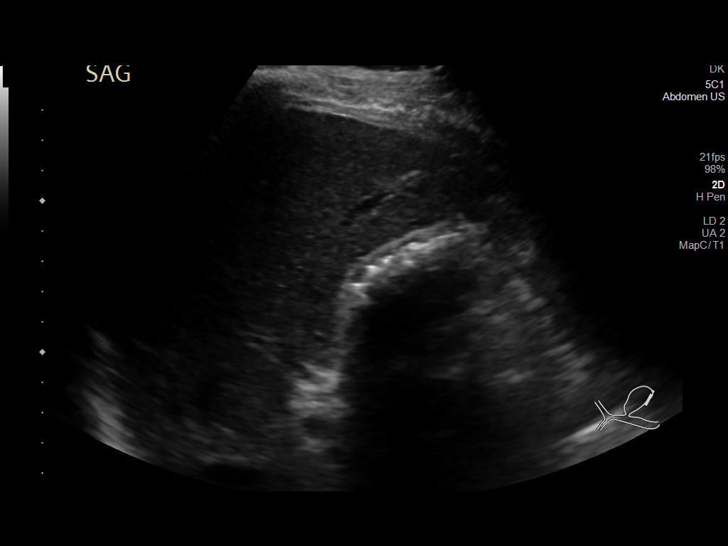
[im 12/27]
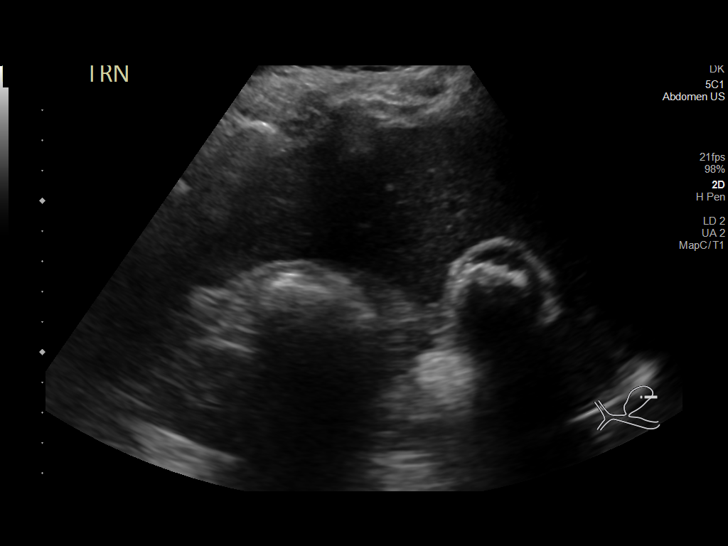
[im 15/27]
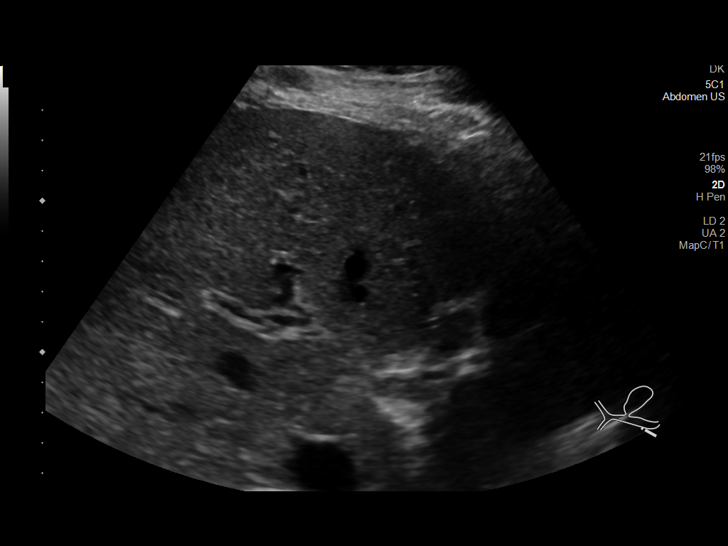
[im 17/27]
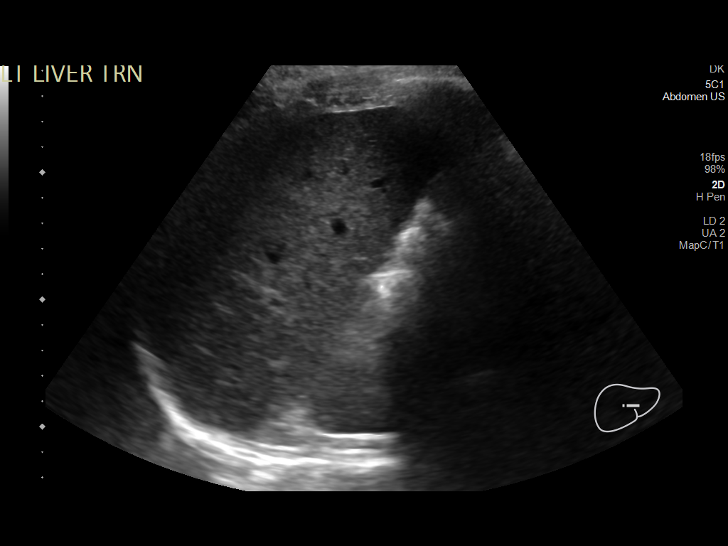
[im 18/27]
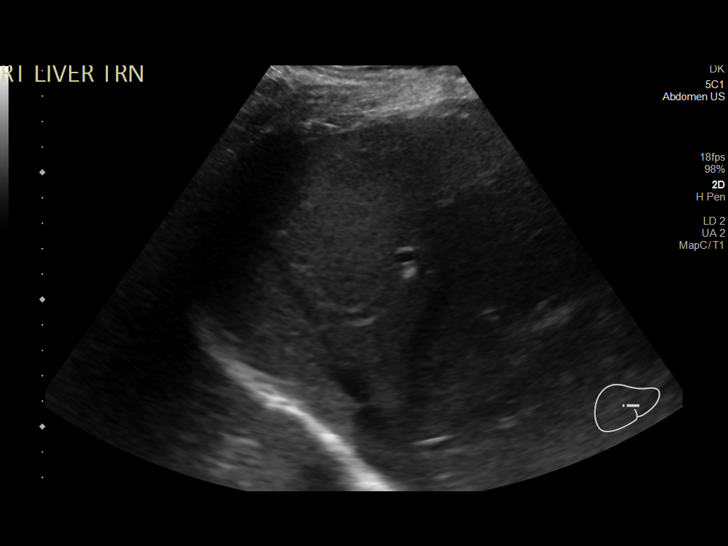
[im 20/27]
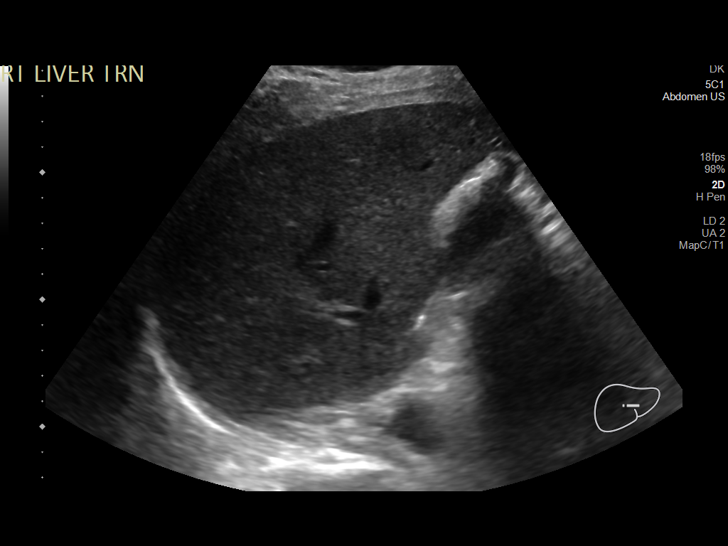
[im 22/27]
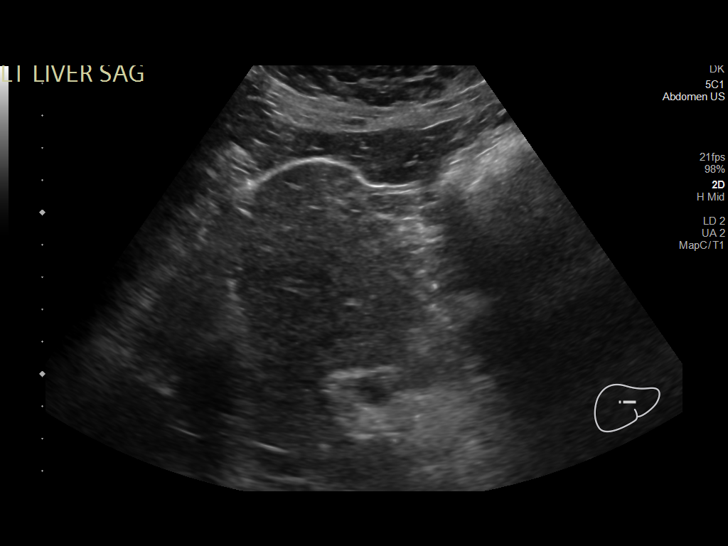
[im 24/27]
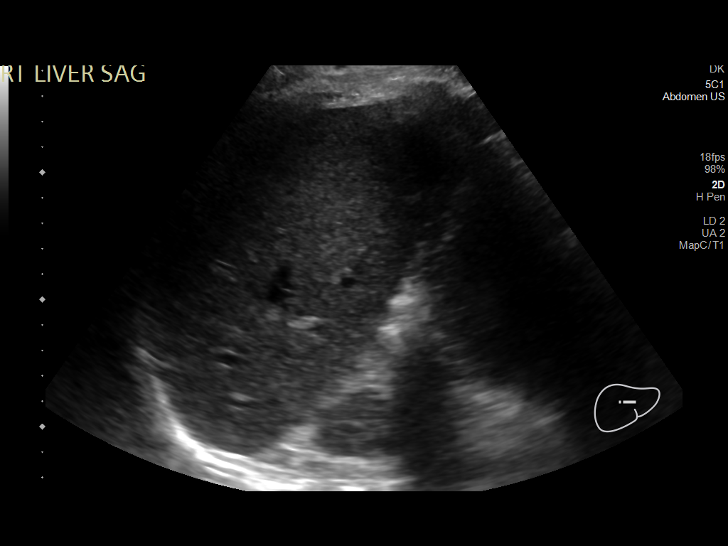
[im 27/27]
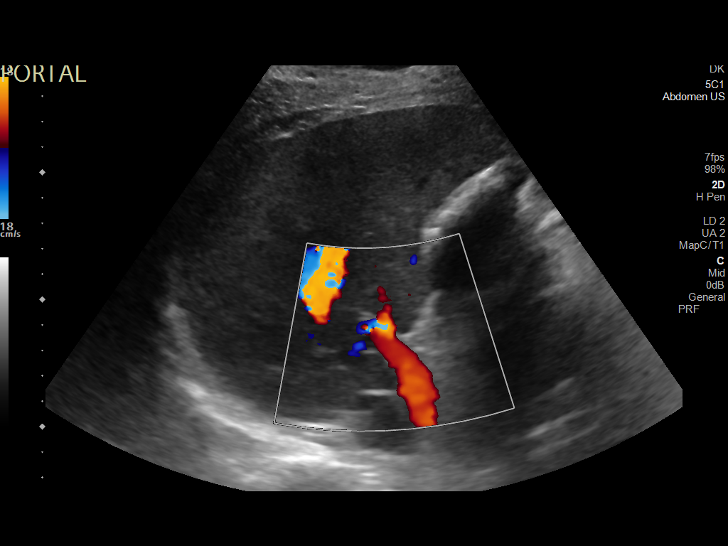

[14 of 25 positions shown; findings below may reference images not displayed]

FINDINGS: Gallbladder:

Gallbladder contains multiple echogenic, posteriorly shadowing
gallstones measuring up to 9 mm in maximal diameter. No
pericholecystic fluid or frank gallbladder wall thickening is
evident. Sonographic Murphy sign is reportedly negative.

Common bile duct:

Diameter: 4 mm, nondilated

Liver:

No focal lesion identified. Within normal limits in parenchymal
echogenicity. Portal vein is patent on color Doppler imaging with
normal direction of blood flow towards the liver.

Other: None.
IMPRESSION: Cholelithiasis without convincing sonographic evidence of
cholecystitis.
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
## 2024-02-22 ENCOUNTER — Encounter (HOSPITAL_BASED_OUTPATIENT_CLINIC_OR_DEPARTMENT_OTHER): Payer: Self-pay

## 2024-02-22 ENCOUNTER — Emergency Department (HOSPITAL_BASED_OUTPATIENT_CLINIC_OR_DEPARTMENT_OTHER)
Admission: EM | Admit: 2024-02-22 | Discharge: 2024-02-22 | Disposition: A | Discharge status: Home / Self Care | Payer: Self-pay | Attending: Emergency Medicine | Admitting: Emergency Medicine

## 2024-02-22 ENCOUNTER — Emergency Department (HOSPITAL_BASED_OUTPATIENT_CLINIC_OR_DEPARTMENT_OTHER): Payer: Self-pay

## 2024-02-22 ENCOUNTER — Other Ambulatory Visit: Payer: Self-pay

## 2024-02-22 DIAGNOSIS — N390 Urinary tract infection, site not specified: Secondary | ICD-10-CM | POA: Insufficient documentation

## 2024-02-22 DIAGNOSIS — N200 Calculus of kidney: Secondary | ICD-10-CM

## 2024-02-22 DIAGNOSIS — N132 Hydronephrosis with renal and ureteral calculous obstruction: Secondary | ICD-10-CM | POA: Insufficient documentation

## 2024-02-22 LAB — CBC
HCT: 41.2 % (ref 39.0–52.0)
Hemoglobin: 14.5 g/dL (ref 13.0–17.0)
MCH: 30 pg (ref 26.0–34.0)
MCHC: 35.2 g/dL (ref 30.0–36.0)
MCV: 85.1 fL (ref 80.0–100.0)
Platelets: 364 10*3/uL (ref 150–400)
RBC: 4.84 MIL/uL (ref 4.22–5.81)
RDW: 13.2 % (ref 11.5–15.5)
WBC: 14.6 10*3/uL — ABNORMAL HIGH (ref 4.0–10.5)
nRBC: 0 % (ref 0.0–0.2)

## 2024-02-22 LAB — COMPREHENSIVE METABOLIC PANEL WITH GFR
ALT: 21 U/L (ref 0–44)
AST: 25 U/L (ref 15–41)
Albumin: 4.5 g/dL (ref 3.5–5.0)
Alkaline Phosphatase: 75 U/L (ref 38–126)
Anion gap: 7 (ref 5–15)
BUN: 17 mg/dL (ref 6–20)
CO2: 28 mmol/L (ref 22–32)
Calcium: 9.8 mg/dL (ref 8.9–10.3)
Chloride: 102 mmol/L (ref 98–111)
Creatinine, Ser: 1.15 mg/dL (ref 0.61–1.24)
GFR, Estimated: >60 mL/min (ref >60)
Glucose, Bld: 118 mg/dL — ABNORMAL HIGH (ref 70–99)
Potassium: 4 mmol/L (ref 3.5–5.1)
Sodium: 137 mmol/L (ref 135–145)
Total Bilirubin: 0.6 mg/dL (ref 0.0–1.2)
Total Protein: 7.6 g/dL (ref 6.5–8.1)

## 2024-02-22 LAB — URINALYSIS, ROUTINE W REFLEX MICROSCOPIC
Bilirubin Urine: NEGATIVE
Glucose, UA: NEGATIVE mg/dL
Ketones, ur: NEGATIVE mg/dL
Leukocytes,Ua: NEGATIVE
Nitrite: NEGATIVE
Protein, ur: 30 mg/dL — AB
RBC / HPF: >50 RBC/hpf (ref 0–5)
Specific Gravity, Urine: >1.046 — ABNORMAL HIGH (ref 1.005–1.030)
pH: 6 (ref 5.0–8.0)

## 2024-02-22 LAB — LIPASE, BLOOD: Lipase: 17 U/L (ref 11–51)

## 2024-02-22 MED ORDER — HYDROCODONE-ACETAMINOPHEN 5-325 MG PO TABS: 1.0000 | ORAL_TABLET | Freq: Four times a day (QID) | ORAL | 0 refills | Status: AC | PRN

## 2024-02-22 MED ORDER — SODIUM CHLORIDE 0.9 % IV BOLUS
1000.0000 mL | Freq: Once | INTRAVENOUS | Status: AC
  Administered 2024-02-22: 1000 mL via INTRAVENOUS

## 2024-02-22 MED ORDER — CEPHALEXIN 500 MG PO CAPS
500.0000 mg | ORAL_CAPSULE | Freq: Two times a day (BID) | ORAL | 0 refills | Status: AC
Start: 2024-02-22 — End: 2024-02-29

## 2024-02-22 MED ORDER — IOHEXOL 300 MG/ML  SOLN
100.0000 mL | Freq: Once | INTRAMUSCULAR | Status: AC | PRN
Start: 2024-02-22 — End: 2024-02-22
  Administered 2024-02-22: 100 mL via INTRAVENOUS

## 2024-02-22 MED ORDER — SODIUM CHLORIDE 0.9 % IV SOLN
1.0000 g | Freq: Once | INTRAVENOUS | Status: AC
  Administered 2024-02-22: 1 g via INTRAVENOUS
  Filled 2024-02-22: qty 10

## 2024-02-22 MED ORDER — MORPHINE SULFATE (PF) 4 MG/ML IV SOLN
4.0000 mg | Freq: Once | INTRAVENOUS | Status: AC
  Administered 2024-02-22: 4 mg via INTRAVENOUS
  Filled 2024-02-22: qty 1

## 2024-02-22 MED ORDER — KETOROLAC TROMETHAMINE 30 MG/ML IJ SOLN
15.0000 mg | Freq: Once | INTRAMUSCULAR | Status: AC
  Administered 2024-02-22: 15 mg via INTRAVENOUS
  Filled 2024-02-22: qty 1

## 2024-02-22 MED ORDER — ONDANSETRON HCL 4 MG/2ML IJ SOLN
4.0000 mg | Freq: Once | INTRAMUSCULAR | Status: AC
  Administered 2024-02-22: 4 mg via INTRAVENOUS
  Filled 2024-02-22: qty 2

## 2024-02-22 MED ORDER — TAMSULOSIN HCL 0.4 MG PO CAPS: 0.4000 mg | ORAL_CAPSULE | Freq: Every day | ORAL | 0 refills | Status: AC

## 2024-02-22 MED ORDER — HYDROMORPHONE HCL 1 MG/ML IJ SOLN
1.0000 mg | Freq: Once | INTRAMUSCULAR | Status: AC
  Administered 2024-02-22: 1 mg via INTRAVENOUS
  Filled 2024-02-22: qty 1

## 2024-02-22 NOTE — ED Provider Notes (Signed)
  EMERGENCY DEPARTMENT AT [facility]  Provider Note  CSN: [id number] Arrival date & time: 02/22/24 0130  History Chief Complaint  Patient presents with   Abdominal Pain   History via Video interpreter [name] is a 38 y.o. male here with family for evaluation of several hours of severe, constant RLQ abdominal pain radiating into R flank, associated with vomiting and similar in character to perforated duodenal ulcer require exlap in 2016. No fever, no dysuria, no blood in stool or in emesis.    Home Medications Prior to Admission medications   Medication Sig Start Date End Date Taking? Authorizing Provider  cephALEXin  (KEFLEX ) 500 MG capsule Take 1 capsule (500 mg total) by mouth 2 (two) times daily for 7 days. 02/22/24 02/29/24 Yes Charmayne Cooper, MD  HYDROcodone -acetaminophen  (NORCO/VICODIN) 5-325 MG tablet Take 1 tablet by mouth every 6 (six) hours as needed for severe pain (pain score 7-10). 02/22/24  Yes Charmayne Cooper, MD  tamsulosin  (FLOMAX ) 0.4 MG CAPS capsule Take 1 capsule (0.4 mg total) by mouth daily. 02/22/24  Yes Charmayne Cooper, MD     Allergies    Nsaids   Review of Systems   Review of Systems Please see HPI for pertinent positives and negatives  Physical Exam BP (!) 128/94   Pulse (!) 58   Temp 98 F (36.7 C) (Oral)   Resp 18   SpO2 92%   Physical Exam Vitals and nursing note reviewed.  Constitutional:      Appearance: Normal appearance.  HENT:     Head: Normocephalic and atraumatic.     Nose: Nose normal.     Mouth/Throat:     Mouth: Mucous membranes are moist.  Eyes:     Extraocular Movements: Extraocular movements intact.     Conjunctiva/sclera: Conjunctivae normal.  Cardiovascular:     Rate and Rhythm: Normal rate.  Pulmonary:     Effort: Pulmonary effort is normal.     Breath sounds: Normal breath sounds.  Abdominal:     General: Abdomen is flat.     Palpations: Abdomen is soft.     Tenderness: There is  abdominal tenderness in the right lower quadrant. There is no guarding. Positive signs include McBurney's sign. Negative signs include Murphy's sign and Rovsing's sign.  Musculoskeletal:        General: No swelling. Normal range of motion.     Cervical back: Neck supple.  Skin:    General: Skin is warm and dry.  Neurological:     General: No focal deficit present.     Mental Status: He is alert.  Psychiatric:        Mood and Affect: Mood normal.     ED Results / Procedures / Treatments   EKG EKG Interpretation Date/Time:  Sunday February 22 2024 02:31:52 EDT Ventricular Rate:  67 PR Interval:  57 QRS Duration:  125 QT Interval:  399 QTC Calculation: 422 R Axis:   87  Text Interpretation: Sinus rhythm Short PR interval Nonspecific intraventricular conduction delay Artifact in lead(s) I III aVR aVL No significant change since last tracing Confirmed by Shawnee Dellen [phone]) on 02/22/2024 2:38:14 AM  Procedures Procedures  Medications Ordered in the ED Medications  cefTRIAXone  (ROCEPHIN ) 1 g in sodium chloride  0.9 % 100 mL IVPB (1 g Intravenous New Bag/Given 02/22/24 0637)  morphine  (PF) 4 MG/ML injection 4 mg (4 mg Intravenous Given 02/22/24 0239)  ondansetron  (ZOFRAN ) injection 4 mg (4 mg Intravenous Given 02/22/24 0239)  sodium chloride  0.9 % bolus 1,000 mL (0 mLs Intravenous Stopped 02/22/24 0617)  iohexol  (OMNIPAQUE ) 300 MG/ML solution 100 mL (100 mLs Intravenous Contrast Given 02/22/24 0336)  HYDROmorphone  (DILAUDID ) injection 1 mg (1 mg Intravenous Given 02/22/24 0346)  ketorolac  (TORADOL ) 30 MG/ML injection 15 mg (15 mg Intravenous Given 02/22/24 0554)    Initial Impression and Plan  Patient here with RLQ pain, consider appendicitis vs renal colic. Less likely PUD given location. Will check labs, pain/nausea meds and IVF for comfort. CT scan.   ED Course   Clinical Course as of 02/22/24 0649  Sun Feb 22, 2024  0238 CBC with leukocytosis.  [CS]  0303 CMP and lipase are  normal.  [CS]  6578 I personally viewed the images from radiology studies and agree with radiologist interpretation: CT shows a distal R ureteral stone as a cause of symptoms. Incidental gall stones.  [CS]  0608 Patient's pain is improved. Awaiting UA but anticipate discharge with Rx for Norco, Motrin and Flomax  with outpatient urology follow up.  [CS]  [id number] UA with blood, WBC and few bacteria. Will give a dose of rocephin  and add Abx to his discharge meds.  [CS]    Clinical Course User Index [CS] Charmayne Cooper, MD     MDM Rules/Calculators/A&P Medical Decision Making Given presenting complaint, I considered that admission might be necessary. After review of results from ED lab and/or imaging studies, admission to the hospital is not indicated at this time.    Problems Addressed: Kidney stone: acute illness or injury Urinary tract infection with hematuria, site unspecified: acute illness or injury  Amount and/or Complexity of Data Reviewed Labs: ordered. Decision-making details documented in ED Course. Radiology: ordered and independent interpretation performed. Decision-making details documented in ED Course. ECG/medicine tests: ordered and independent interpretation performed. Decision-making details documented in ED Course.  Risk Prescription drug management. Parenteral controlled substances. Decision regarding hospitalization.     Final Clinical Impression(s) / ED Diagnoses Final diagnoses:  Kidney stone  Urinary tract infection with hematuria, site unspecified    Rx / DC Orders ED Discharge Orders          Ordered    HYDROcodone -acetaminophen  (NORCO/VICODIN) 5-325 MG tablet  Every 6 hours PRN        02/22/24 0633    tamsulosin  (FLOMAX ) 0.4 MG CAPS capsule  Daily        02/22/24 0633    cephALEXin  (KEFLEX ) 500 MG capsule  2 times daily        02/22/24 4696             Charmayne Cooper, MD 02/22/24 [phone]

## 2024-02-22 NOTE — ED Triage Notes (Signed)
 Pt states that he has been having RLQ abd pain that started tonight with n/v, hx of perf ulcer and this feels the same

## 2024-02-23 LAB — URINE CULTURE: Culture: NO GROWTH

## 2024-11-23 ENCOUNTER — Other Ambulatory Visit: Payer: Self-pay

## 2024-11-23 ENCOUNTER — Ambulatory Visit (HOSPITAL_COMMUNITY)
Admission: EM | Admit: 2024-11-23 | Discharge: 2024-11-23 | Disposition: A | Discharge status: Home / Self Care | Payer: Self-pay | Attending: Family Medicine | Admitting: Family Medicine

## 2024-11-23 ENCOUNTER — Encounter (HOSPITAL_COMMUNITY): Payer: Self-pay | Admitting: *Deleted

## 2024-11-23 DIAGNOSIS — S39012A Strain of muscle, fascia and tendon of lower back, initial encounter: Secondary | ICD-10-CM

## 2024-11-23 LAB — POCT URINALYSIS DIP (MANUAL ENTRY)
Bilirubin, UA: NEGATIVE
Glucose, UA: NEGATIVE mg/dL
Ketones, POC UA: NEGATIVE mg/dL
Leukocytes, UA: NEGATIVE
Nitrite, UA: NEGATIVE
Protein Ur, POC: NEGATIVE mg/dL
Spec Grav, UA: >=1.03 — AB
Urobilinogen, UA: 0.2 U/dL
pH, UA: 6

## 2024-11-23 MED ORDER — PREDNISONE 20 MG PO TABS: 40.0000 mg | ORAL_TABLET | Freq: Every day | ORAL | 0 refills | Status: AC

## 2024-11-23 MED ORDER — TIZANIDINE HCL 4 MG PO TABS: 4.0000 mg | ORAL_TABLET | Freq: Three times a day (TID) | ORAL | 0 refills | Status: AC

## 2024-11-23 NOTE — Discharge Instructions (Addendum)
 You are having a muscle spasm of your back. I have sent the medicine tizanidine  to your pharmacy.  This is a muscle relaxant that you can take as needed to help with pain. You may continue to take Tylenol  as well for the pain. I have sent a 5-day course of prednisone  to your pharmacy, this should help reduce inflammation to additionally help with your pain. If this is not improving or you start to have fevers or urinary symptoms please go to the emergency room.

## 2024-11-23 NOTE — ED Triage Notes (Signed)
 PT reports his back hurts where his Kidney is on Lt . Pt had a Kidney infection per Pt on same side.

## 2024-11-23 NOTE — ED Provider Notes (Signed)
 " [facility]    CSN: [id number] Arrival date & time: 11/23/24  1621      History   Chief Complaint Chief Complaint  Patient presents with   Back Pain    HPI ROEMELLO SPEYER is a 39 y.o. male.   He presents with sudden onset right-sided back pain.  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 - Pain rated 8/10 - No clear heavy lifting trigger - Pain worsens when rising from bed, toilet, or other seated or lying positions - No radiation to the legs - No numbness, tingling, or saddle anesthesia - No hematuria, urinary frequency, or fever - No other systemic symptoms - Tylenol  taken for relief, dose and frequency unclear - Similar but more severe episode in the past affecting both sides, treatment details unknown  Relevant medical and occupational history - History of kidney stones - No medication allergies - No history of stomach or kidney problems - Employed in landscaping  The history is provided by the patient. A language interpreter was used.    Past Medical History:  Diagnosis Date   Constipation, chronic 08/15/2015   Gastritis    Dx in [place] ~2010   Perforated gastric ulcer (HCC) 08/2015   Tobacco abuse 08/15/2015    Patient Active Problem List   Diagnosis Date Noted   Perforated duodenal ulcer  08/15/2015   Spanish speaking patient 08/15/2015   Tobacco abuse 08/15/2015   Constipation, chronic 08/15/2015   Perforated duodenal bulb ulcer (HCC) 08/15/2015    Past Surgical History:  Procedure Laterality Date   LAPAROTOMY N/A 08/15/2015   Procedure: EXPLORATORY LAPAROTOMY duodenal perforation repair;  Surgeon: Elspeth Schultze, MD;  Location: [facility];  Service: General;  Laterality: N/A;       Home Medications    Prior to Admission medications  Medication Sig Start Date End Date Taking? Authorizing Provider  predniSONE  (DELTASONE ) 20 MG tablet Take 2 tablets (40 mg total) by mouth daily with breakfast  for 5 days. 11/23/24 11/28/24 Yes Alba Sharper, MD  tiZANidine  (ZANAFLEX ) 4 MG tablet Take 1 tablet (4 mg total) by mouth 3 (three) times daily for 7 days. 11/23/24 11/30/24 Yes Girard Koontz, MD  HYDROcodone -acetaminophen  (NORCO/VICODIN) 5-325 MG tablet Take 1 tablet by mouth every 6 (six) hours as needed for severe pain (pain score 7-10). 02/22/24   Roselyn Carlin NOVAK, MD  tamsulosin  (FLOMAX ) 0.4 MG CAPS capsule Take 1 capsule (0.4 mg total) by mouth daily. 02/22/24   Roselyn Carlin NOVAK, MD    Family History Family History  Problem Relation Age of Onset   Alcohol abuse Father     Social History Social History[1]   Allergies   Nsaids   Review of Systems Review of Systems   Physical Exam Triage Vital Signs ED Triage Vitals  Encounter Vitals Group     BP 11/23/24 1730 (!) 93/49     Girls Systolic BP Percentile --      Girls Diastolic BP Percentile --      Boys Systolic BP Percentile --      Boys Diastolic BP Percentile --      Pulse Rate 11/23/24 1730 61     Resp 11/23/24 1730 20     Temp 11/23/24 1730 98.8 F (37.1 C)     Temp src --      SpO2 11/23/24 1730 97 %     Weight --      Height --  Head Circumference --      Peak Flow --      Pain Score 11/23/24 1737 8     Pain Loc --      Pain Education --      Exclude from Growth Chart --    No data found.  Updated Vital Signs BP 91/61   Pulse 62   Temp 98.8 F (37.1 C)   Resp 20   SpO2 95%   Visual Acuity Right Eye Distance:   Left Eye Distance:   Bilateral Distance:    Right Eye Near:   Left Eye Near:    Bilateral Near:     Physical Exam Vitals and nursing note reviewed.  Constitutional:      General: He is not in acute distress.    Appearance: He is well-developed.  HENT:     Head: Normocephalic and atraumatic.  Eyes:     Conjunctiva/sclera: Conjunctivae normal.  Cardiovascular:     Rate and Rhythm: Normal rate and regular rhythm.     Heart sounds: No murmur heard. Pulmonary:      Effort: Pulmonary effort is normal. No respiratory distress.     Breath sounds: Normal breath sounds.  Abdominal:     Palpations: Abdomen is soft.     Tenderness: There is no abdominal tenderness.  Musculoskeletal:        General: No swelling.     Cervical back: Neck supple.     Lumbar back: Spasms and tenderness present. No swelling, edema or deformity. Decreased range of motion. Negative left straight leg raise test.     Comments: Prominently tender/spasming left paraspinal muscle  Skin:    General: Skin is warm and dry.     Capillary Refill: Capillary refill takes less than 2 seconds.  Neurological:     Mental Status: He is alert.  Psychiatric:        Mood and Affect: Mood normal.      UC Treatments / Results  Labs (all labs ordered are listed, but only abnormal results are displayed) Labs Reviewed  POCT URINALYSIS DIP (MANUAL ENTRY) - Abnormal; Notable for the following components:      Result Value   Spec Grav, UA >=1.030 (*)    Blood, UA trace-intact (*)    All other components within normal limits    EKG   Radiology No results found.  Procedures Procedures (including critical care time)  Medications Ordered in UC Medications - No data to display  Initial Impression / Assessment and Plan / UC Course  I have reviewed the triage vital signs and the nursing notes.  Pertinent labs & imaging results that were available during my care of the patient were reviewed by me and considered in my medical decision making (see chart for details).     Exam today strongly consistent with left sided paraspinal muscle spasm. 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 May represent herniated disc however would not change treatment plan regardless. Notably patient does have history of renal stone; however, he currently has no urinary symptoms, nausea, or vomiting. I do not feel he needs urgent CT, as his pain is better explained by muscular spasm. Reassuringly as  well, he remains afebrile, with stable vital signs. Unfortunately, patient is not an NSAID candidate due to history of gastric ulcer, and prior renal stone. Will send steroid burst to patient's pharmacy, and as needed tizandine  for pain relief. May continue to take Tylenol . Discussed that if his pain is not  improving, he has new urinary symptoms, or fevers, that he needs to go the emergency department. Recommended to start back strengthening physical therapy once this acute flare as resolved in order to prevent future injury.   Final Clinical Impressions(s) / UC Diagnoses   Final diagnoses:  Strain of lumbar region, initial encounter     Discharge Instructions      You are having a muscle spasm of your back. I have sent the medicine tizanidine  to your pharmacy.  This is a muscle relaxant that you can take as needed to help with pain. You may continue to take Tylenol  as well for the pain. I have sent a 5-day course of prednisone  to your pharmacy, this should help reduce inflammation to additionally help with your pain. If this is not improving or you start to have fevers or urinary symptoms please go to the emergency room.     ED Prescriptions     Medication Sig Dispense Auth. Provider   predniSONE  (DELTASONE ) 20 MG tablet Take 2 tablets (40 mg total) by mouth daily with breakfast for 5 days. 10 tablet Tiernan Millikin, MD   tiZANidine  (ZANAFLEX ) 4 MG tablet Take 1 tablet (4 mg total) by mouth 3 (three) times daily for 7 days. 21 tablet Jax Kentner, MD      PDMP not reviewed this encounter.    [1]  Social History Tobacco Use   Smoking status: Every Day    Types: Cigarettes   Smokeless tobacco: Never  Substance Use Topics   Alcohol use: Yes    Alcohol/week: 0.0 standard drinks of alcohol    Comment: quit   Drug use: Yes     Alba Sharper, MD 11/23/24 1834  "
